# Patient Record
Sex: Male | Born: 1970 | Race: Black or African American | Hispanic: No | Marital: Single
Health system: Midwestern US, Community
[De-identification: ages and names within clinical notes are randomized; demographics above are authoritative.]

## PROBLEM LIST (undated history)

## (undated) DIAGNOSIS — R079 Chest pain, unspecified: Principal | ICD-10-CM

## (undated) DIAGNOSIS — I2510 Atherosclerotic heart disease of native coronary artery without angina pectoris: Secondary | ICD-10-CM

## (undated) DIAGNOSIS — I10 Essential (primary) hypertension: Secondary | ICD-10-CM

---

## 2011-01-22 MED ORDER — TRIMETHOPRIM-SULFAMETHOXAZOLE 160 MG-800 MG TAB
160-800 mg | ORAL_TABLET | Freq: Two times a day (BID) | ORAL | Status: AC
Start: 2011-01-22 — End: 2011-02-01

## 2011-01-22 NOTE — ED Notes (Signed)
Noticed after yesterday am mark on rt anterior forearm "bite" cont to swell and ooze white fluid. Also on rt side of head just above ear.

## 2011-01-22 NOTE — ED Notes (Signed)
I have reviewed discharge instructions with the patient.  The patient verbalized understanding.      Pt given RX for following medications at discharge   New prescriptions   Medication Sig Dispense Refill   ??? trimethoprim-sulfamethoxazole (BACTRIM DS) 160-800 mg per tablet Take 2 Tabs by mouth two (2) times a day for 10 days.  40 Tab  0   ??? lisinopril (PRINIVIL, ZESTRIL) 10 mg tablet Take  by mouth daily.         .  Pt discharged ambulatory from emergency department in no acute distress.

## 2011-01-22 NOTE — ED Provider Notes (Signed)
Patient is a 40 y.o. male presenting with Insect Bite. The history is provided by the patient.   Insect Bite  This is a new problem. The current episode started yesterday. The problem occurs daily. Pertinent negatives include no chest pain. The symptoms are aggravated by nothing. The symptoms are relieved by nothing. He has tried nothing for the symptoms.        Past Medical History   Diagnosis Date   ??? Hypertension    ??? Asthma         No past surgical history on file.      No family history on file.     History     Social History   ??? Marital Status: Single     Spouse Name: N/A     Number of Children: N/A   ??? Years of Education: N/A     Occupational History   ??? Not on file.     Social History Main Topics   ??? Smoking status: Not on file   ??? Smokeless tobacco: Not on file   ??? Alcohol Use: No   ??? Drug Use:    ??? Sexually Active:      Other Topics Concern   ??? Not on file     Social History Narrative   ??? No narrative on file                  ALLERGIES: Review of patient's allergies indicates no known allergies.      Review of Systems   Constitutional: Negative.    Cardiovascular: Negative for chest pain.   Skin: Positive for rash.   All other systems reviewed and are negative.        Filed Vitals:    01/22/11 0648   BP: 157/88   Pulse: 70   Temp: 98 ??F (36.7 ??C)   Resp: 18   Height: 6' (1.829 m)   Weight: 95.255 kg (210 lb)   SpO2: 98%            Physical Exam   Vitals reviewed.  Constitutional: He appears well-developed and well-nourished. No distress.   Musculoskeletal: Normal range of motion.   Skin: Skin is warm and dry. There is erythema (to rt fa, over lt ear.).        MDM    Procedures

## 2014-10-15 NOTE — H&P (Signed)
 [facility]    History and Physical     Assessment   Active Hospital Problems    Angioedema      HTN (hypertension)        Plan   Patient will be placed in medical unit for monitoring. Angioedema will be treated with Benadryl and Pepcid and IV Solu-Medrol.    We'll continue to monitor patient's blood pressure.    History   Christopher Cline is a 44 y.o. male who presents with   History of Present Illness  Patient states he woke this morning with tongue swelling. Had some minor difficulty swallowing but no respiratory distress. Patient can think of nothing out of the ordinary that he is done. He is ingested no new foods. He is taking no new medications. He's taken no foreign substances. Including illegal substances or over-the-counter medications. Patient states he's eaten no new foods. Patient states that he hasn't had no recent illnesses. Patient states that he does not suffer from seasonal allergies.    Patient states this morning when he woke his voice was hoarse. This has improved slightly. Patient also had some difficulty swallowing this also has improved some. The extensive patient's symptoms seem to only involve the tongue. Patient will be admitted for evaluation. Monitoring patient in the emergency room over several hours showed no significant improvement for patient's safety it was felt best to monitor him here overnight.    Past Medical History   Diagnosis Date    Hypertension      Past Surgical History   Procedure Laterality Date    Ankle fracture surgery        No Known Allergies  Prior to Admission medications    Medication Sig Start Date End Date Taking? Authorizing Provider   amLODIPine besylate (NORVASC) 10 mg tablet Take 10 mg by mouth daily.   Yes Historical Provider, MD     History     Social History    Marital Status: Single     Spouse Name: N/A    Number of Children: N/A    Years of Education: N/A     Social History Main Topics    Smoking status: Current Every  Day Smoker -- 0.50 packs/day     Types: Cigarettes    Smokeless tobacco: Not on file    Alcohol Use: Yes    Drug Use: Not on file    Sexual Activity: Not on file     Other Topics Concern    Not on file     Social History Narrative     Family History   Problem Relation Age of Onset    Hypertension Mother     Hypertension Father      Review of Systems   HENT:        Angioedema tongue       Physical Examination   Temp:  [98.1 F (36.7 C)] 98.1 F (36.7 C)  Heart Rate:  [66-84] 68  Resp:  [18-20] 20  BP: (147-154)/(84-101) 147/84 mmHg  SpO2:  [98 %-100 %] 98 %     O2 Device: None (Room air)       Physical Exam   Constitutional: He is oriented to person, place, and time. He appears well-developed and well-nourished.   HENT:   Head: Normocephalic and atraumatic.   Mouth/Throat:       Eyes: EOM are normal. Pupils are equal, round, and reactive to light.   Neck: Normal range of motion.  Cardiovascular: Normal rate, regular rhythm, normal heart sounds and intact distal pulses.    Pulmonary/Chest: Effort normal and breath sounds normal. He has no wheezes.   Abdominal: Soft. Bowel sounds are normal.   Neurological: He is alert and oriented to person, place, and time.   Psychiatric: He has a normal mood and affect. His behavior is normal.   Vitals reviewed.    Results   Labs:   No results found for this or any previous visit (from the past 24 hour(s)).  Imaging:  No results found.  ECG:  No results found.    Electronically signed:  Gladis Picker III, PA-C  10/15/2014 / 12:30 PM

## 2014-10-16 NOTE — Discharge Summary (Signed)
 [facility]  Discharge Summary    PCP: No primary care provider on file.  Discharge Details     Admit date:         10/15/2014  Discharge date:        10/16/2014   Hospital LOS:    1 days    Active Hospital Problems    Diagnosis Date Noted POA    Angioedema 10/15/2014 Yes    HTN (hypertension) 10/15/2014 Yes      Resolved Hospital Problems    Diagnosis Date Noted Date Resolved POA   No resolved problems to display.      Current Discharge Medication List      NEW medications    Details   diphenhydrAMINE (BENADRYL) 50 mg capsule Take 1 capsule (50 mg total) by mouth every 6 (six) hours.  Start date: 10/16/2014, End date: 10/26/2014      famotidine (PEPCID) 20 MG tablet Take 1 tablet (20 mg total) by mouth 2 (two) times daily.  Start date: 10/16/2014, End date: 10/16/2015      predniSONE (DELTASONE) 10 mg tablet 5 po daily for 4 days then 4 po daily for 4 days then 3 po daily for 4 days  then 2 po daily for 4 days then 1 po daily for 4 days  Start date: 10/16/2014         CHANGED medications    Details   amLODIPine besylate (NORVASC) 10 mg tablet Take 1 tablet (10 mg total) by mouth daily.  Start date: 10/16/2014, End date: 10/16/2015           Hospital Course   Physicians involved in care during this hospitalization  Attending Provider: Devere Person, MD  Attending Provider: Toribio LITTIE Croak, MD  Admitting Provider: Toribio LITTIE Croak, MD  Consulting Physician: Toribio LITTIE Croak, MD  Consulting Physician: Lamar LOISE Quan, MD    Indication for Admission: idiopathic angioedema  Hospital Course:        Mr. Mane is a 44 year old gentleman relatively healthy with only a previously diagnosed history of hypertension. He is currently out of work. He denies any recent illnesses or injuries. He presented himself to the emergency department yesterday morning with swelling of the tongue. There was no identifiable cause to this swelling. Patient was observed for several hours in the emergency department without any  significant reduction in swelling.There was no significant respiratory compromise however it was felt  Best to observe the patient overnight.    Patient was admitted to the hospital started on Benadryl IV Solu-Medrol and an H2 blocker. Patient responded well with significantly decreased swelling. Due to the swelling beneath patient's tongue ENT was asked to see patient. There was no evidence of infection. An patient's symptoms abated overnight.    On morning of discharge patient has no respiratory complaints swelling has seemed to resolve completely. And patient feels he can go home.    Patient will be discharged home and he will continue his previously prescribed antihypertensive he'll also be discharged on Pepcid oral steroids as well as an oral antihistamine.  Patient's been asked [facility]. He has no PCP. Patient has been explained the importance of follow-up for his previously diagnosed hypertension as well as reevaluation of this unexplained episode of angioedema. He states his understanding follow-up of both of these conditions.    Physical exam  Gen. No apparent distress  Head normocephalic atraumatic  Oral exam no edema of  the tongue or sublingual space submental space is nontender and without erythema or heat, voice without hoarseness  Neck supple full range of motion no lymphadenopathy or swelling  Chest rise and fall with inspiration and expiration no adventitious breath sounds  Heart regular rate and rhythm no murmurs gallops or rubs  Abdomen soft nontender nondistended normal bowel sounds no hepatosplenomegaly  Neuro no focal neurological deficits elicited on examination    Bedside Procedures     No orders found          [facility]     Activity Instructions     Activity as tolerated                     Other Instructions     Discharge instructions                   Contact information for follow-up              [facility]    [address]  [place] [address]     Phone:  [phone]                 Recommendations to physicians: Follow blood pressure    Potential for Rehab:        Good    Code Status:   Full Code      Time spent in discharge process:  less than 30 minutes       Electronically signed:  Gladis Picker III, PA-C  10/16/2014 / 9:20 AM

## 2017-12-30 ENCOUNTER — Emergency Department: Admit: 2017-12-30 | Payer: Self-pay | Primary: Student in an Organized Health Care Education/Training Program

## 2017-12-30 ENCOUNTER — Inpatient Hospital Stay
Admit: 2017-12-30 | Discharge: 2017-12-30 | Disposition: A | Discharge status: Home / Self Care | Payer: Self-pay | Attending: Emergency Medicine

## 2017-12-30 DIAGNOSIS — M25511 Pain in right shoulder: Secondary | ICD-10-CM

## 2017-12-30 MED ORDER — MELOXICAM 15 MG TAB
15 mg | ORAL_TABLET | Freq: Every day | ORAL | 0 refills | Status: AC
Start: 2017-12-30 — End: ?

## 2017-12-30 NOTE — ED Triage Notes (Signed)
Patient reports right shoulder pain starting today. 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 Denies trauma or injury

## 2017-12-30 NOTE — ED Notes (Signed)
I have reviewed discharge instructions with the patient.  The patient verbalized understanding.    Patient left ED via Discharge Method: ambulatory to Home with self.    Opportunity for questions and clarification provided.       Patient given 1 scripts.         To continue your aftercare when you leave the hospital, you may receive an automated call from our care team to check in on how you are doing.  This is a free service and part of our promise to provide the best care and service to meet your aftercare needs.??? If you have questions, or wish to unsubscribe from this service please call [phone].  Thank you for Choosing our [facility].

## 2017-12-30 NOTE — ED Provider Notes (Signed)
Patient is here with right shoulder pain that started a couple of weeks ago and got worse today.  He states he does lift heavy things at work but does not remember particular injury.  He states he may have injured it years ago, but he cannot remember what the exact injury was. He has not had any chest pain, shortness of breath, neck pain, headache, dizziness, weakness, dyspnea and exertion, fever, swelling/tingling or weakness to his arms or legs, trouble with urination or bowel movements or other new symptoms.  He was ambulatory to the room without difficulty, and well-hydrated.    The history is provided by the patient.   Shoulder Pain    The incident occurred more than 1 week ago. There was no injury mechanism. The right shoulder is affected. The pain is at a severity of 8/10. The pain is moderate. The pain has been constant since onset. The pain does not radiate. There is no history of shoulder injury. He has no other injuries. There is no history of shoulder surgery. Pertinent negatives include no numbness, no muscle weakness and no tingling.        Past Medical History:   Diagnosis Date   ??? Asthma    ??? Hypertension        No past surgical history on file.      No family history on file.    Social History     Socioeconomic History   ??? Marital status: SINGLE     Spouse name: Not on file   ??? Number of children: Not on file   ??? Years of education: Not on file   ??? Highest education level: Not on file   Occupational History   ??? Not on file   Social Needs   ??? Financial resource strain: Not on file   ??? Food insecurity:     Worry: Not on file     Inability: Not on file   ??? Transportation needs:     Medical: Not on file     Non-medical: Not on file   Tobacco Use   ??? Smoking status: Not on file   Substance and Sexual Activity   ??? Alcohol use: No   ??? Drug use: Not on file   ??? Sexual activity: Not on file   Lifestyle   ??? Physical activity:     Days per week: Not on file     Minutes per session: Not on file    ??? Stress: Not on file   Relationships   ??? Social connections:     Talks on phone: Not on file     Gets together: Not on file     Attends religious service: Not on file     Active member of club or organization: Not on file     Attends meetings of clubs or organizations: Not on file     Relationship status: Not on file   ??? Intimate partner violence:     Fear of current or ex partner: Not on file     Emotionally abused: Not on file     Physically abused: Not on file     Forced sexual activity: Not on file   Other Topics Concern   ??? Not on file   Social History Narrative   ??? Not on file         ALLERGIES: Patient has no known allergies.    Review of Systems   Constitutional: Negative.    HENT: Negative.  Eyes: Negative.    Respiratory: Negative.    Cardiovascular: Negative.    Gastrointestinal: Negative.    Genitourinary: Negative.    Musculoskeletal: Negative.         Right shoulder pain   Skin: Negative.    Neurological: Negative.  Negative for tingling and numbness.   Psychiatric/Behavioral: Negative.    All other systems reviewed and are negative.      Vitals:    12/30/17 1609   BP: 129/79   Pulse: 70   Resp: 16   Temp: 98 ??F (36.7 ??C)   SpO2: 98%   Weight: 95.3 kg (210 lb)   Height: 6' (1.829 m)            Physical Exam   Constitutional: He is oriented to person, place, and time. He appears well-developed and well-nourished.   HENT:   Head: Normocephalic and atraumatic.   Right Ear: External ear normal.   Left Ear: External ear normal.   Nose: Nose normal.   Mouth/Throat: Oropharynx is clear and moist.   Eyes: Pupils are equal, round, and reactive to light. Conjunctivae and EOM are normal.   Neck: Normal range of motion. Neck supple.   Cardiovascular: Normal rate, regular rhythm, normal heart sounds and intact distal pulses.   Pulmonary/Chest: Effort normal and breath sounds normal.   Abdominal: Soft. Bowel sounds are normal.   Musculoskeletal: Normal range of motion.        Arms:   Neurological: He is alert and oriented to person, place, and time. He has normal reflexes.   Skin: Skin is warm and dry.   Psychiatric: He has a normal mood and affect. His behavior is normal. Judgment and thought content normal.   Nursing note and vitals reviewed.       MDM  Number of Diagnoses or Management Options  Acute pain of right shoulder:      Amount and/or Complexity of Data Reviewed  Tests in the radiology section of CPT??: ordered and reviewed    Risk of Complications, Morbidity, and/or Mortality  Presenting problems: moderate  Diagnostic procedures: moderate  Management options: moderate    Patient Progress  Patient progress: stable         Procedures    The patient was observed in the ED.    Results Reviewed:  XR SHOULDER RT AP/LAT MIN 2 V   Final Result   Impression:      No acute osseous or joint abnormalities.                    Patient was referred to orthopedics today for further evaluation.  He may need an MRI for definitive care.  We have placed him in a sling for comfort.  I have written some meloxicam at home daily to see if that would help with his pain.  He was shown stretching exercises at home to do daily. Rest, ice, elevate, avoid painful activities. ED if worse. Follow up with Ortho for recheck. Patient is stable for discharge and ambulatory out of the ER without difficulty.    I discussed the results of all labs, procedures, radiographs, and treatments with the patient and available family.  Treatment plan is agreed upon and the patient is ready for discharge.  All voiced understanding of the discharge plan and medication instructions or changes as appropriate.  Questions about treatment in the ED were answered.  All were encouraged to return should symptoms worsen or new problems develop.

## 2017-12-30 NOTE — ED Notes (Signed)
 I have reviewed discharge instructions with the patient.  The patient verbalized understanding.    Patient left ED via Discharge Method: ambulatory to Home with self.    Opportunity for questions and clarification provided.       Patient given 1 scripts.         To continue your aftercare when you leave the hospital, you may receive an automated call from our care team to check in on how you are doing.  This is a free service and part of our promise to provide the best care and service to meet your aftercare needs." If you have questions, or wish to unsubscribe from this service please call [phone].  Thank you for Choosing our [facility].

## 2017-12-30 NOTE — ED Provider Notes (Signed)
ED Provider Notes by Joanie Coddington, PA at 12/30/17 1654                Author: Joanie Coddington, PA  Service: Emergency Medicine  Author Type: Physician Assistant       Filed: 12/30/17 1730  Date of Service: 12/30/17 1654  Status: Attested           Editor: Morehouse-Moore, Charm Barges, PA (Physician Assistant)  Cosigner: Flowers, Damon Ruffin Pyo., MD at 12/30/17 1824          Attestation signed by Collier Salina, Bonner Puna., MD at 12/30/17 1824          I was personally available for consultation in the emergency department.  I have reviewed the chart and agree with the documentation recorded by the [facility], including  the assessment, treatment plan, and disposition.   Damon Steger Sheral Flow., MD                                    Patient is here with right shoulder pain that started a couple of weeks ago and got worse today.  He states he  does lift heavy things at work but does not remember particular injury.  He states he may have injured it years ago, but he cannot remember what the exact injury was. He has not had any chest pain, shortness of breath, neck pain, headache, dizziness,  weakness, dyspnea and exertion, fever, swelling/tingling or weakness to his arms or legs, trouble with urination or bowel movements or other new symptoms.  He was ambulatory to the room without difficulty, and well-hydrated.      The history is provided by the patient.    Shoulder Pain     The  incident occurred more than 1 week ago. There was no injury mechanism. The right shoulder is affected. The pain is at a severity of 8/10.  The pain is moderate. The pain has been constant since onset. The pain does not radiate. There is no history of shoulder injury. He  has no other injuries. There is no history of shoulder surgery. Pertinent negatives include no numbness, no muscle weakness and no tingling.             Past Medical History:        Diagnosis  Date         ?  Asthma           ?  Hypertension             No  past surgical history on file.        No family history on file.        Social History          Socioeconomic History         ?  Marital status:  SINGLE              Spouse name:  Not on file         ?  Number of children:  Not on file     ?  Years of education:  Not on file     ?  Highest education level:  Not on file       Occupational History        ?  Not on file       Social Needs         ?  Financial resource strain:  Not on file        ?  Food insecurity:              Worry:  Not on file         Inability:  Not on file        ?  Transportation needs:              Medical:  Not on file              Non-medical:  Not on file       Tobacco Use         ?  Smoking status:  Not on file       Substance and Sexual Activity         ?  Alcohol use:  No     ?  Drug use:  Not on file     ?  Sexual activity:  Not on file       Lifestyle        ?  Physical activity:              Days per week:  Not on file         Minutes per session:  Not on file         ?  Stress:  Not on file       Relationships        ?  Social connections:              Talks on phone:  Not on file         Gets together:  Not on file         Attends religious service:  Not on file         Active member of club or organization:  Not on file         Attends meetings of clubs or organizations:  Not on file         Relationship status:  Not on file        ?  Intimate partner violence:              Fear of current or ex partner:  Not on file         Emotionally abused:  Not on file         Physically abused:  Not on file         Forced sexual activity:  Not on file        Other Topics  Concern        ?  Not on file       Social History Narrative        ?  Not on file              ALLERGIES: Patient has no known allergies.      Review of Systems    Constitutional: Negative.     HENT: Negative.     Eyes: Negative.     Respiratory: Negative.     Cardiovascular: Negative.     Gastrointestinal: Negative.     Genitourinary: Negative.     Musculoskeletal:  Negative.          Right shoulder pain    Skin: Negative.     Neurological: Negative.  Negative for tingling and numbness.    Psychiatric/Behavioral: Negative.     All other systems reviewed and are negative.  Vitals:          12/30/17 1609        BP:  129/79     Pulse:  70     Resp:  16     Temp:  98 ??F (36.7 ??C)     SpO2:  98%     Weight:  95.3 kg (210 lb)        Height:  6' (1.829 m)                Physical Exam    Constitutional: He is oriented to person, place, and time. He appears well-developed and well-nourished.    HENT:    Head: Normocephalic and atraumatic.   Right Ear: External ear normal.   Left Ear: External ear normal.    Nose: Nose normal.    Mouth/Throat: Oropharynx is clear and moist.    Eyes: Pupils are equal, round, and reactive to light. Conjunctivae and EOM are normal.    Neck: Normal range of motion. Neck supple.    Cardiovascular: Normal rate, regular rhythm, normal heart sounds and intact distal pulses.    Pulmonary/Chest: Effort normal and breath sounds normal.    Abdominal: Soft. Bowel sounds are normal.   Musculoskeletal: Normal range of motion.        Arms:    Neurological: He is alert and oriented to person, place, and time. He has  normal reflexes.    Skin: Skin is warm and dry.   Psychiatric: He has a normal mood and affect. His behavior is normal. Judgment and thought content  normal.    Nursing note and vitals reviewed.          MDM   Number of Diagnoses or Management Options   Acute pain of right shoulder:        Amount and/or Complexity of Data Reviewed   Tests in the radiology section of CPT??: ordered and reviewed      Risk of Complications, Morbidity, and/or Mortality   Presenting problems: moderate  Diagnostic procedures: moderate  Management options: moderate     Patient Progress   Patient progress: stable             Procedures      The patient was observed in the ED.      Results Reviewed:     XR SHOULDER RT AP/LAT MIN 2 V       Final Result     Impression:           No acute osseous or joint abnormalities.                                   Patient was referred to orthopedics today for further evaluation.  He may need an MRI for definitive care.  We have placed him in a sling for comfort.  I have written some meloxicam at home daily to see if that would help with his pain.  He was shown  stretching exercises at home to do daily. Rest, ice, elevate, avoid painful activities. ED if worse. Follow up with Ortho for recheck. Patient is stable for discharge and ambulatory out of the ER without difficulty.      I discussed the results of all labs, procedures, radiographs, and treatments with the patient and available family.  Treatment plan is agreed upon and the patient is ready for discharge.   All voiced understanding of the  discharge plan and medication instructions or changes as appropriate.  Questions about treatment in the ED were answered.  All were encouraged to return should symptoms worsen or new problems develop.

## 2017-12-30 NOTE — ED Notes (Signed)
Patient reports right shoulder pain starting today. 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 Denies trauma or injury

## 2018-01-03 ENCOUNTER — Inpatient Hospital Stay
Admit: 2018-01-03 | Discharge: 2018-01-03 | Disposition: A | Discharge status: Home / Self Care | Payer: Self-pay | Attending: Emergency Medicine

## 2018-01-03 DIAGNOSIS — K5289 Other specified noninfective gastroenteritis and colitis: Secondary | ICD-10-CM

## 2018-01-03 LAB — CBC WITH AUTOMATED DIFF
ABS. BASOPHILS: 0 10*3/uL (ref 0.0–0.2)
ABS. EOSINOPHILS: 0.1 10*3/uL (ref 0.0–0.8)
ABS. IMM. GRANS.: 0 10*3/uL (ref 0.0–0.5)
ABS. LYMPHOCYTES: 1.6 10*3/uL (ref 0.5–4.6)
ABS. MONOCYTES: 0.4 10*3/uL (ref 0.1–1.3)
ABS. NEUTROPHILS: 3.4 10*3/uL (ref 1.7–8.2)
ABSOLUTE NRBC: 0 10*3/uL (ref 0.0–0.2)
BASOPHILS: 0 % (ref 0.0–2.0)
EOSINOPHILS: 2 % (ref 0.5–7.8)
HCT: 42.3 % (ref 41.1–50.3)
HGB: 13.6 g/dL (ref 13.6–17.2)
IMMATURE GRANULOCYTES: 1 % (ref 0.0–5.0)
LYMPHOCYTES: 28 % (ref 13–44)
MCH: 29 PG (ref 26.1–32.9)
MCHC: 32.2 g/dL (ref 31.4–35.0)
MCV: 90.2 FL (ref 79.6–97.8)
MONOCYTES: 7 % (ref 4.0–12.0)
MPV: 10 FL (ref 9.4–12.3)
NEUTROPHILS: 62 % (ref 43–78)
PLATELET: 195 10*3/uL (ref 150–450)
RBC: 4.69 M/uL (ref 4.23–5.6)
RDW: 14 % (ref 11.9–14.6)
WBC: 5.6 10*3/uL (ref 4.3–11.1)

## 2018-01-03 LAB — C REACTIVE PROTEIN, QT: C-Reactive protein: 0.4 mg/dL (ref 0.0–0.9)

## 2018-01-03 LAB — METABOLIC PANEL, COMPREHENSIVE
A-G Ratio: 1.2 (ref 1.2–3.5)
ALT (SGPT): 25 U/L (ref 12–65)
AST (SGOT): 43 U/L — ABNORMAL HIGH (ref 15–37)
Albumin: 4.1 g/dL (ref 3.5–5.0)
Alk. phosphatase: 91 U/L (ref 50–136)
Anion gap: 7 mmol/L (ref 7–16)
BUN: 15 MG/DL (ref 6–23)
Bilirubin, total: 0.4 MG/DL (ref 0.2–1.1)
CO2: 26 mmol/L (ref 21–32)
Calcium: 9.3 MG/DL (ref 8.3–10.4)
Chloride: 106 mmol/L (ref 98–107)
Creatinine: 1.32 MG/DL (ref 0.8–1.5)
GFR est AA: >60 mL/min/{1.73_m2} (ref >60)
GFR est non-AA: >60 mL/min/{1.73_m2} (ref >60)
Globulin: 3.4 g/dL (ref 2.3–3.5)
Glucose: 83 mg/dL (ref 65–100)
Potassium: 5 mmol/L (ref 3.5–5.1)
Protein, total: 7.5 g/dL (ref 6.3–8.2)
Sodium: 139 mmol/L (ref 136–145)

## 2018-01-03 LAB — LIPASE
Lipase: 99 U/L (ref 73–393)
Lipase: 99 U/L (ref 73–393)

## 2018-01-03 LAB — CBC WITH AUTO DIFFERENTIAL
Basophils %: 0 % (ref 0.0–2.0)
Basophils Absolute: 0 10*3/uL (ref 0.0–0.2)
Eosinophils %: 2 % (ref 0.5–7.8)
Eosinophils Absolute: 0.1 10*3/uL (ref 0.0–0.8)
Granulocyte Absolute Count: 0 10*3/uL (ref 0.0–0.5)
Hematocrit: 42.3 % (ref 41.1–50.3)
Hemoglobin: 13.6 g/dL (ref 13.6–17.2)
Immature Granulocytes: 1 % (ref 0.0–5.0)
Lymphocytes %: 28 % (ref 13–44)
Lymphocytes Absolute: 1.6 10*3/uL (ref 0.5–4.6)
MCH: 29 PG (ref 26.1–32.9)
MCHC: 32.2 g/dL (ref 31.4–35.0)
MCV: 90.2 FL (ref 79.6–97.8)
MPV: 10 FL (ref 9.4–12.3)
Monocytes %: 7 % (ref 4.0–12.0)
Monocytes Absolute: 0.4 10*3/uL (ref 0.1–1.3)
NRBC Absolute: 0 10*3/uL (ref 0.0–0.2)
Neutrophils %: 62 % (ref 43–78)
Neutrophils Absolute: 3.4 10*3/uL (ref 1.7–8.2)
Platelets: 195 10*3/uL (ref 150–450)
RBC: 4.69 M/uL (ref 4.23–5.6)
RDW: 14 % (ref 11.9–14.6)
WBC: 5.6 10*3/uL (ref 4.3–11.1)

## 2018-01-03 LAB — COMPREHENSIVE METABOLIC PANEL
ALT: 25 U/L (ref 12–65)
AST: 43 U/L — ABNORMAL HIGH (ref 15–37)
Albumin/Globulin Ratio: 1.2 (ref 1.2–3.5)
Albumin: 4.1 g/dL (ref 3.5–5.0)
Alkaline Phosphatase: 91 U/L (ref 50–136)
Anion Gap: 7 mmol/L (ref 7–16)
BUN: 15 MG/DL (ref 6–23)
CO2: 26 mmol/L (ref 21–32)
Calcium: 9.3 MG/DL (ref 8.3–10.4)
Chloride: 106 mmol/L (ref 98–107)
Creatinine: 1.32 MG/DL (ref 0.8–1.5)
EGFR IF NonAfrican American: >60 mL/min/{1.73_m2} (ref >60)
GFR African American: >60 mL/min/{1.73_m2} (ref >60)
Globulin: 3.4 g/dL (ref 2.3–3.5)
Glucose: 83 mg/dL (ref 65–100)
Potassium: 5 mmol/L (ref 3.5–5.1)
Sodium: 139 mmol/L (ref 136–145)
Total Bilirubin: 0.4 MG/DL (ref 0.2–1.1)
Total Protein: 7.5 g/dL (ref 6.3–8.2)

## 2018-01-03 LAB — C-REACTIVE PROTEIN: CRP: 0.4 mg/dL (ref 0.0–0.9)

## 2018-01-03 MED ORDER — HYOSCYAMINE SULFATE 0.125 MG TAB
0.125 mg | ORAL_TABLET | ORAL | 0 refills | Status: AC | PRN
Start: 2018-01-03 — End: ?

## 2018-01-03 MED ORDER — ONDANSETRON HCL 4 MG TAB
4 mg | ORAL_TABLET | Freq: Three times a day (TID) | ORAL | 0 refills | Status: AC | PRN
Start: 2018-01-03 — End: ?

## 2018-01-03 MED ORDER — HYOSCYAMINE 0.125 MG SUBLINGUAL TAB
0.125 mg | SUBLINGUAL | Status: AC
Start: 2018-01-03 — End: 2018-01-03
  Administered 2018-01-03: 15:00:00 via SUBLINGUAL

## 2018-01-03 MED ORDER — KETOROLAC TROMETHAMINE 30 MG/ML INJECTION
30 mg/mL (1 mL) | INTRAMUSCULAR | Status: AC
Start: 2018-01-03 — End: 2018-01-03
  Administered 2018-01-03: 15:00:00 via INTRAVENOUS

## 2018-01-03 MED ORDER — ONDANSETRON (PF) 4 MG/2 ML INJECTION
4 mg/2 mL | INTRAMUSCULAR | Status: AC
Start: 2018-01-03 — End: 2018-01-03
  Administered 2018-01-03: 15:00:00 via INTRAVENOUS

## 2018-01-03 MED FILL — KETOROLAC TROMETHAMINE 30 MG/ML INJECTION: 30 mg/mL (1 mL) | INTRAMUSCULAR | Qty: 1

## 2018-01-03 MED FILL — ONDANSETRON (PF) 4 MG/2 ML INJECTION: 4 mg/2 mL | INTRAMUSCULAR | Qty: 2

## 2018-01-03 MED FILL — HYOSCYAMINE 0.125 MG SUBLINGUAL TAB: 0.125 mg | SUBLINGUAL | Qty: 1

## 2018-01-03 NOTE — ED Provider Notes (Signed)
Pt reports right sided abd pain that started today. States diarrhea for 2 days. Pt was recently diagnosed with colitis. Arrives via EMS. Denies vomiting, reports nausea. Denies blood in stool. Hx of HTN.  Patient states she was diagnosed with ulcerative colitis while living in [place].  Those records are unavailable for review.  He also states he has been on antibiotics within the last month.    The history is provided by the patient.   Abdominal Pain    This is a new problem. The current episode started 12 to 24 hours ago. The problem occurs constantly. The problem has not changed since onset.Associated with: diarrhea and a history of colitis. The pain is located in the generalized abdominal region. The quality of the pain is cramping. The pain is at a severity of 9/10. The pain is severe. Associated symptoms include diarrhea and dysuria. Pertinent negatives include no anorexia, no fever, no belching, no flatus, no hematochezia, no melena, no nausea, no vomiting, no constipation, no frequency, no hematuria, no headaches, no arthralgias, no myalgias, no trauma, no chest pain and no back pain. Nothing worsens the pain. The pain is relieved by nothing. His past medical history is significant for ulcerative colitis. The patient's surgical history non-contributory.       Past Medical History:   Diagnosis Date   ??? Asthma    ??? Hypertension        History reviewed. No pertinent surgical history.      History reviewed. No pertinent family history.    Social History     Socioeconomic History   ??? Marital status: SINGLE     Spouse name: Not on file   ??? Number of children: Not on file   ??? Years of education: Not on file   ??? Highest education level: Not on file   Occupational History   ??? Not on file   Social Needs   ??? Financial resource strain: Not on file   ??? Food insecurity:     Worry: Not on file     Inability: Not on file   ??? Transportation needs:     Medical: Not on file     Non-medical: Not on file   Tobacco Use    ??? Smoking status: Not on file   Substance and Sexual Activity   ??? Alcohol use: No   ??? Drug use: Not on file   ??? Sexual activity: Not on file   Lifestyle   ??? Physical activity:     Days per week: Not on file     Minutes per session: Not on file   ??? Stress: Not on file   Relationships   ??? Social connections:     Talks on phone: Not on file     Gets together: Not on file     Attends religious service: Not on file     Active member of club or organization: Not on file     Attends meetings of clubs or organizations: Not on file     Relationship status: Not on file   ??? Intimate partner violence:     Fear of current or ex partner: Not on file     Emotionally abused: Not on file     Physically abused: Not on file     Forced sexual activity: Not on file   Other Topics Concern   ??? Not on file   Social History Narrative   ??? Not on file  ALLERGIES: Lisinopril and Lisinopril    Review of Systems   Constitutional: Negative for fever.   Cardiovascular: Negative for chest pain.   Gastrointestinal: Positive for abdominal pain and diarrhea. Negative for anorexia, constipation, flatus, hematochezia, melena, nausea and vomiting.   Genitourinary: Positive for dysuria. Negative for frequency and hematuria.   Musculoskeletal: Negative for arthralgias, back pain and myalgias.   Neurological: Negative for headaches.   All other systems reviewed and are negative.      Vitals:    01/03/18 1004 01/03/18 1112   BP: 123/83 131/79   Pulse: 68 60   Resp: 16    Temp: 97.5 ??F (36.4 ??C)    SpO2: 96% 99%   Weight: 89.8 kg (198 lb)    Height: 6' (1.829 m)             Physical Exam   Constitutional: He is oriented to person, place, and time. He appears well-developed and well-nourished.  Non-toxic appearance. He does not appear ill. No distress.   HENT:   Head: Normocephalic and atraumatic.   Eyes: Pupils are equal, round, and reactive to light. Conjunctivae and EOM are normal.   Neck: Normal range of motion. Neck supple.    Cardiovascular: Normal rate and regular rhythm.   Pulmonary/Chest: Effort normal and breath sounds normal.   Abdominal: Soft. Bowel sounds are normal. He exhibits no distension and no mass. There is tenderness. There is no rebound and no guarding. No hernia.   Bowel sounds are present, abdomen is minimally and diffusely tender.  No guarding or rigidity   Musculoskeletal: Normal range of motion.   Neurological: He is alert and oriented to person, place, and time.   Skin: Skin is warm and dry. Capillary refill takes less than 2 seconds. He is not diaphoretic.   Psychiatric: He has a normal mood and affect. His behavior is normal.   Nursing note and vitals reviewed.       MDM  Number of Diagnoses or Management Options     Amount and/or Complexity of Data Reviewed  Clinical lab tests: ordered and reviewed  Review and summarize past medical records: yes  Independent visualization of images, tracings, or specimens: yes    Risk of Complications, Morbidity, and/or Mortality  Presenting problems: moderate  Diagnostic procedures: moderate  Management options: moderate    Patient Progress  Patient progress: stable         Procedures

## 2018-01-03 NOTE — ED Notes (Signed)
I have reviewed discharge instructions with the patient.  The patient verbalized understanding.    Patient left ED via Discharge Method: ambulatory to Home with self  Opportunity for questions and clarification provided.       Patient given 2 scripts.         To continue your aftercare when you leave the hospital, you may receive an automated call from our care team to check in on how you are doing.  This is a free service and part of our promise to provide the best care and service to meet your aftercare needs.??? If you have questions, or wish to unsubscribe from this service please call [phone].  Thank you for Choosing our [facility].

## 2018-01-03 NOTE — ED Notes (Signed)
Unable to obtain blood in triage

## 2018-01-03 NOTE — ED Notes (Signed)
bloodwork sent to lab

## 2018-01-03 NOTE — ED Notes (Signed)
Blood recollect sent down to lab

## 2018-01-03 NOTE — ED Triage Notes (Signed)
Pt reports right sided abd pain that started today. States diarrhea for 2 days. Pt was recently diagnosed with colitis. Arrives via EMS. Denies vomiting, reports nausea. Denies blood in stool. Hx of HTN

## 2018-01-03 NOTE — ED Notes (Signed)
Pt asked for a urine sample. States "I will try."

## 2018-01-03 NOTE — ED Notes (Signed)
Pt reports right sided abd pain that started today. States diarrhea for 2 days. Pt was recently diagnosed with colitis. Arrives via EMS. Denies vomiting, reports nausea. Denies blood in stool. Hx of HTN

## 2018-01-03 NOTE — ED Notes (Signed)
Blood recollect sent down to lab

## 2018-01-03 NOTE — ED Provider Notes (Signed)
Pt reports right sided abd pain that started today. States diarrhea for 2 days. Pt was recently diagnosed with colitis. Arrives via EMS. Denies vomiting, reports nausea. Denies blood in stool. Hx of HTN.  Patient states she was diagnosed with ulcerative colitis while living in [place].  Those records are unavailable for review.  He also states he has been on antibiotics within the last month.    The history is provided by the patient.   Abdominal Pain    This is a new problem. The current episode started 12 to 24 hours ago. The problem occurs constantly. The problem has not changed since onset.Associated with: diarrhea and a history of colitis. The pain is located in the generalized abdominal region. The quality of the pain is cramping. The pain is at a severity of 9/10. The pain is severe. Associated symptoms include diarrhea and dysuria. Pertinent negatives include no anorexia, no fever, no belching, no flatus, no hematochezia, no melena, no nausea, no vomiting, no constipation, no frequency, no hematuria, no headaches, no arthralgias, no myalgias, no trauma, no chest pain and no back pain. Nothing worsens the pain. The pain is relieved by nothing. His past medical history is significant for ulcerative colitis. The patient's surgical history non-contributory.       Past Medical History:   Diagnosis Date   ??? Asthma    ??? Hypertension        History reviewed. No pertinent surgical history.      History reviewed. No pertinent family history.    Social History     Socioeconomic History   ??? Marital status: SINGLE     Spouse name: Not on file   ??? Number of children: Not on file   ??? Years of education: Not on file   ??? Highest education level: Not on file   Occupational History   ??? Not on file   Social Needs   ??? Financial resource strain: Not on file   ??? Food insecurity:     Worry: Not on file     Inability: Not on file   ??? Transportation needs:     Medical: Not on file     Non-medical: Not on file   Tobacco Use   ???  Smoking status: Not on file   Substance and Sexual Activity   ??? Alcohol use: No   ??? Drug use: Not on file   ??? Sexual activity: Not on file   Lifestyle   ??? Physical activity:     Days per week: Not on file     Minutes per session: Not on file   ??? Stress: Not on file   Relationships   ??? Social connections:     Talks on phone: Not on file     Gets together: Not on file     Attends religious service: Not on file     Active member of club or organization: Not on file     Attends meetings of clubs or organizations: Not on file     Relationship status: Not on file   ??? Intimate partner violence:     Fear of current or ex partner: Not on file     Emotionally abused: Not on file     Physically abused: Not on file     Forced sexual activity: Not on file   Other Topics Concern   ??? Not on file   Social History Narrative   ??? Not on file  ALLERGIES: Lisinopril and Lisinopril    Review of Systems   Constitutional: Negative for fever.   Cardiovascular: Negative for chest pain.   Gastrointestinal: Positive for abdominal pain and diarrhea. Negative for anorexia, constipation, flatus, hematochezia, melena, nausea and vomiting.   Genitourinary: Positive for dysuria. Negative for frequency and hematuria.   Musculoskeletal: Negative for arthralgias, back pain and myalgias.   Neurological: Negative for headaches.   All other systems reviewed and are negative.      Vitals:    01/03/18 1004 01/03/18 1112   BP: 123/83 131/79   Pulse: 68 60   Resp: 16    Temp: 97.5 ??F (36.4 ??C)    SpO2: 96% 99%   Weight: 89.8 kg (198 lb)    Height: 6' (1.829 m)             Physical Exam   Constitutional: He is oriented to person, place, and time. He appears well-developed and well-nourished.  Non-toxic appearance. He does not appear ill. No distress.   HENT:   Head: Normocephalic and atraumatic.   Eyes: Pupils are equal, round, and reactive to light. Conjunctivae and EOM are normal.   Neck: Normal range of motion. Neck supple.   Cardiovascular: Normal  rate and regular rhythm.   Pulmonary/Chest: Effort normal and breath sounds normal.   Abdominal: Soft. Bowel sounds are normal. He exhibits no distension and no mass. There is tenderness. There is no rebound and no guarding. No hernia.   Bowel sounds are present, abdomen is minimally and diffusely tender.  No guarding or rigidity   Musculoskeletal: Normal range of motion.   Neurological: He is alert and oriented to person, place, and time.   Skin: Skin is warm and dry. Capillary refill takes less than 2 seconds. He is not diaphoretic.   Psychiatric: He has a normal mood and affect. His behavior is normal.   Nursing note and vitals reviewed.       MDM  Number of Diagnoses or Management Options     Amount and/or Complexity of Data Reviewed  Clinical lab tests: ordered and reviewed  Review and summarize past medical records: yes  Independent visualization of images, tracings, or specimens: yes    Risk of Complications, Morbidity, and/or Mortality  Presenting problems: moderate  Diagnostic procedures: moderate  Management options: moderate    Patient Progress  Patient progress: stable         Procedures

## 2018-01-03 NOTE — ED Notes (Signed)

## 2018-01-03 NOTE — ED Notes (Signed)
Pt asked for a urine sample. States "I will try."

## 2018-10-08 ENCOUNTER — Encounter (HOSPITAL_COMMUNITY): Payer: Self-pay

## 2018-10-08 ENCOUNTER — Other Ambulatory Visit: Payer: Self-pay

## 2018-10-08 ENCOUNTER — Emergency Department (HOSPITAL_COMMUNITY)
Admission: EM | Admit: 2018-10-08 | Discharge: 2018-10-08 | Disposition: A | Discharge status: Home / Self Care | Payer: Self-pay | Attending: Emergency Medicine | Admitting: Emergency Medicine

## 2018-10-08 DIAGNOSIS — G8929 Other chronic pain: Secondary | ICD-10-CM

## 2018-10-08 DIAGNOSIS — M545 Low back pain: Secondary | ICD-10-CM | POA: Insufficient documentation

## 2018-10-08 LAB — POCT I-STAT EG7
Acid-base deficit: 1 mmol/L (ref 0.0–2.0)
Bicarbonate: 23 mmol/L (ref 20.0–28.0)
Calcium, Ion: 1.09 mmol/L — ABNORMAL LOW (ref 1.15–1.40)
HCT: 40 % (ref 39.0–52.0)
Hemoglobin: 13.6 g/dL (ref 13.0–17.0)
O2 SAT: 97 %
PO2 VEN: 92 mmHg — AB (ref 32.0–45.0)
Potassium: 3.8 mmol/L (ref 3.5–5.1)
Sodium: 142 mmol/L (ref 135–145)
TCO2: 24 mmol/L (ref 22–32)
pCO2, Ven: 36.1 mmHg — ABNORMAL LOW (ref 44.0–60.0)
pH, Ven: 7.412 (ref 7.250–7.430)

## 2018-10-08 LAB — URINALYSIS, ROUTINE W REFLEX MICROSCOPIC
Bilirubin Urine: NEGATIVE
Glucose, UA: NEGATIVE mg/dL
Hgb urine dipstick: NEGATIVE
Ketones, ur: NEGATIVE mg/dL
Leukocytes,Ua: NEGATIVE
NITRITE: NEGATIVE
Protein, ur: NEGATIVE mg/dL
Specific Gravity, Urine: 1.014 (ref 1.005–1.030)
pH: 5 (ref 5.0–8.0)

## 2018-10-08 LAB — I-STAT CREATININE, ED: CREATININE: 1.2 mg/dL (ref 0.61–1.24)

## 2018-10-08 LAB — CBG MONITORING, ED: GLUCOSE-CAPILLARY: 77 mg/dL (ref 70–99)

## 2018-10-08 MED ORDER — LIDOCAINE 5 % EX PTCH: 1.0000 | MEDICATED_PATCH | CUTANEOUS | 0 refills | Status: DC

## 2018-10-08 MED ORDER — LIDOCAINE 5 % EX PTCH
1.0000 | MEDICATED_PATCH | CUTANEOUS | Status: DC
  Administered 2018-10-08: 1 via TRANSDERMAL
  Filled 2018-10-08: qty 1

## 2018-10-08 MED ORDER — ACETAMINOPHEN 500 MG PO TABS
1000.0000 mg | ORAL_TABLET | Freq: Once | ORAL | Status: AC
  Administered 2018-10-08: 1000 mg via ORAL
  Filled 2018-10-08: qty 2

## 2018-10-08 MED ORDER — IBUPROFEN 800 MG PO TABS
800.0000 mg | ORAL_TABLET | Freq: Once | ORAL | Status: AC
  Administered 2018-10-08: 800 mg via ORAL
  Filled 2018-10-08: qty 1

## 2018-10-08 NOTE — ED Notes (Signed)
Patient verbalizes understanding of discharge instructions. Opportunity for questioning and answers were provided. Armband removed by staff, pt discharged from ED. Ambulated out to lobby  

## 2018-10-08 NOTE — ED Provider Notes (Signed)
[facility] EMERGENCY DEPARTMENT Provider Note   CSN: [id number] Arrival date & time: 10/08/18  0256    History   Chief Complaint Chief Complaint  Patient presents with  . Back Pain    lower bk; with increased urination    HPI Oscar Daniels is a 48 y.o. male.     The history is provided by the patient.  Back Pain  Location:  Gluteal region Quality:  Aching and stabbing Radiates to:  Does not radiate Pain severity:  Severe Pain is:  Same all the time Onset quality:  Unable to specify Duration:  36 months Timing:  Constant Progression:  Worsening Chronicity:  Chronic Context: not emotional stress, not falling, not jumping from heights, not lifting heavy objects, not MCA, not MVA, not occupational injury, not pedestrian accident, not physical stress, not recent illness, not recent injury and not twisting   Relieved by:  Nothing Worsened by:  Nothing Ineffective treatments:  None tried Associated symptoms: no abdominal pain, no abdominal swelling, no bladder incontinence, no bowel incontinence, no chest pain, no dysuria, no fever, no leg pain, no numbness, no paresthesias, no pelvic pain, no perianal numbness, no tingling, no weakness and no weight loss   Risk factors: no hx of cancer and no recent surgery   No numbness no difficulty urinating or defecating.  No change in gait.  No f/c/r.  No cough.  Urine is frequent and smells "stale".    History reviewed. No pertinent past medical history.  There are no active problems to display for this patient.      Home Medications    Prior to Admission medications   Not on File    Family History History reviewed. No pertinent family history.  Social History Social History   Tobacco Use  . Smoking status: Not on file  Substance Use Topics  . Alcohol use: Not on file  . Drug use: Not on file     Allergies   Patient has no known allergies.   Review of Systems Review of Systems   Constitutional: Negative for fever and weight loss.  HENT: Negative for sore throat.   Eyes: Negative for photophobia.  Respiratory: Negative for cough and shortness of breath.   Cardiovascular: Negative for chest pain.  Gastrointestinal: Negative for abdominal pain and bowel incontinence.  Genitourinary: Positive for frequency. Negative for bladder incontinence, difficulty urinating, discharge, dysuria, hematuria, pelvic pain, penile pain and testicular pain.  Musculoskeletal: Positive for back pain. Negative for gait problem.  Neurological: Negative for tingling, seizures, speech difficulty, weakness, numbness and paresthesias.  All other systems reviewed and are negative.    Physical Exam Updated Vital Signs Temp 97.9 F (36.6 C) (Oral)   Physical Exam Vitals signs and nursing note reviewed.  Constitutional:      Appearance: Oscar Daniels is normal weight.  HENT:     Head: Normocephalic and atraumatic.     Nose: Nose normal.  Eyes:     Conjunctiva/sclera: Conjunctivae normal.     Pupils: Pupils are equal, round, and reactive to light.  Neck:     Musculoskeletal: Normal range of motion and neck supple.  Cardiovascular:     Rate and Rhythm: Normal rate and regular rhythm.     Pulses: Normal pulses.     Heart sounds: Normal heart sounds.  Pulmonary:     Effort: Pulmonary effort is normal.     Breath sounds: Normal breath sounds.  Abdominal:     General: Abdomen is flat. Bowel  sounds are normal.     Tenderness: There is no abdominal tenderness. There is no guarding.     Hernia: No hernia is present.  Musculoskeletal: Normal range of motion.  Skin:    General: Skin is warm and dry.     Capillary Refill: Capillary refill takes less than 2 seconds.  Neurological:     General: No focal deficit present.     Mental Status: Oscar Daniels is alert and oriented to person, place, and time.     Sensory: No sensory deficit.     Motor: No weakness.     Deep Tendon Reflexes: Reflexes normal.   Psychiatric:        Mood and Affect: Mood normal.        Behavior: Behavior normal.      ED Treatments / Results  Labs (all labs ordered are listed, but only abnormal results are displayed) Results for orders placed or performed during the hospital encounter of 10/08/18  I-stat Creatinine, ED  Result Value Ref Range   Creatinine, Ser 1.20 0.61 - 1.24 mg/dL  POCT I-Stat EG7  Result Value Ref Range   pH, Ven 7.412 7.250 - 7.430   pCO2, Ven 36.1 (L) 44.0 - 60.0 mmHg   pO2, Ven 92.0 (H) 32.0 - 45.0 mmHg   Bicarbonate 23.0 20.0 - 28.0 mmol/L   TCO2 24 22 - 32 mmol/L   O2 Saturation 97.0 %   Acid-base deficit 1.0 0.0 - 2.0 mmol/L   Sodium 142 135 - 145 mmol/L   Potassium 3.8 3.5 - 5.1 mmol/L   Calcium, Ion 1.09 (L) 1.15 - 1.40 mmol/L   HCT 40.0 39.0 - 52.0 %   Hemoglobin 13.6 13.0 - 17.0 g/dL   Patient temperature HIDE    Sample type VENOUS    No results found.  Radiology No results found.  Procedures Procedures (including critical care time)  Medications Ordered in ED Medications  acetaminophen (TYLENOL) tablet 1,000 mg (has no administration in time range)  ibuprofen (ADVIL,MOTRIN) tablet 800 mg (has no administration in time range)  lidocaine (LIDODERM) 5 % 1 patch (has no administration in time range)       Final Clinical Impressions(s) / ED Diagnoses   Return for intractable cough, coughing up blood,fevers >100.4 unrelieved by medication, shortness of breath, intractable vomiting, chest pain, shortness of breath, weakness,numbness, changes in speech, facial asymmetry,abdominal pain, passing out,Inability to tolerate liquids or food, cough, altered mental status or any concerns. No signs of systemic illness or infection. The patient is nontoxic-appearing on exam and vital signs are within normal limits.   I have reviewed the triage vital signs and the nursing notes. Pertinent labs &imaging results that were available during my care of the patient were  reviewed by me and considered in my medical decision making (see chart for details).  After history, exam, and medical workup I feel the patient has been appropriately medically screened and is safe for discharge home. Pertinent diagnoses were discussed with the patient. Patient was given return precautions.   Jmari Pelc, MD 10/08/18 [phone]

## 2018-10-08 NOTE — ED Triage Notes (Signed)
Pt arrived via [facility]; [facility] from [facility] with c/o lower bk pain and increased urination; 160/82; 80; 100% on RA; 98.1; no CBG; pt c/o 10/10

## 2018-10-28 ENCOUNTER — Encounter: Payer: Self-pay | Admitting: *Deleted

## 2018-10-28 NOTE — Congregational Nurse Program (Signed)
COVID 19 Hotel Screening: Patient reports kidney stones with a new diagnosis 2 weeks ago. Patient stated he moved from [place] to [facility] in March 2020.

## 2018-10-31 ENCOUNTER — Telehealth: Payer: Self-pay | Admitting: Pediatric Intensive Care

## 2018-10-31 NOTE — Telephone Encounter (Signed)
Client verified by 2 identifiers. Client states recently moving from [place]. He has a history of PTSD, hypertension and recently kidney stones. He does not have access to medication for hypertension. He does not endorse any SI/HI on call. Complains of a headache and would like some medication for that. CN advised she would contact [facility] clinic for referral. Email to Lavinia Sharps FNP for referral. Shann Medal RN BSN CNP [phone]

## 2018-11-01 ENCOUNTER — Telehealth: Payer: Self-pay | Admitting: Pediatric Intensive Care

## 2018-11-01 NOTE — Telephone Encounter (Signed)
Left HIPAA compliant message for client to return call. Client has been referred to [facility] clinic and will need to complete intake packet in order to schedule appointment. Shann Medal RN BSN CNP [phone]

## 2018-11-04 NOTE — Progress Notes (Signed)
COVID Hotel Screening performed. Temperature, PHQ-9, and need for medical care and medications assessed. PHQ-9=3. Patient reports that he turned in prescriptions to [facility] and is still waiting for them. Referral made to [facility].  Carlyle Basques RN MSN

## 2018-11-14 NOTE — Congregational Nurse Program (Signed)
Closing encounter per request. 

## 2018-11-18 NOTE — Progress Notes (Signed)
COVID Hotel Screening performed. Temperature, PHQ-9, and need for medical care and medications assessed. Patient reports issues with depression. Scored 12 on the PHQ-9. Also complains of left side pain that hurts all day and keeps him from sleeping. [facility] has a list of medications that the patient is on, but he does not have any. Scheduled to see Lavinia Sharps on June 4th. Referral to Chales Abrahams to see if appointment can be moved up earlier.  Carlyle Basques RN MSN

## 2018-11-25 NOTE — Progress Notes (Addendum)
COVID Hotel Screening performed. Temperature, PHQ-9, and need for medical care and medications assessed. Patient's blood pressure is up and he has started experiencing headaches and dizziness.Patient states he does not have money to go to ED. Referral sent to [name].  Carlyle Basques RN MSN

## 2018-11-27 ENCOUNTER — Other Ambulatory Visit: Payer: Self-pay

## 2018-11-27 ENCOUNTER — Emergency Department (HOSPITAL_BASED_OUTPATIENT_CLINIC_OR_DEPARTMENT_OTHER)
Admission: EM | Admit: 2018-11-27 | Discharge: 2018-11-27 | Disposition: A | Discharge status: Home / Self Care | Payer: Self-pay | Attending: Emergency Medicine | Admitting: Emergency Medicine

## 2018-11-27 ENCOUNTER — Emergency Department (HOSPITAL_BASED_OUTPATIENT_CLINIC_OR_DEPARTMENT_OTHER): Payer: Self-pay

## 2018-11-27 ENCOUNTER — Encounter (HOSPITAL_BASED_OUTPATIENT_CLINIC_OR_DEPARTMENT_OTHER): Payer: Self-pay | Admitting: Emergency Medicine

## 2018-11-27 DIAGNOSIS — I10 Essential (primary) hypertension: Secondary | ICD-10-CM | POA: Insufficient documentation

## 2018-11-27 DIAGNOSIS — M545 Low back pain, unspecified: Secondary | ICD-10-CM

## 2018-11-27 DIAGNOSIS — F17210 Nicotine dependence, cigarettes, uncomplicated: Secondary | ICD-10-CM | POA: Insufficient documentation

## 2018-11-27 DIAGNOSIS — G8929 Other chronic pain: Secondary | ICD-10-CM | POA: Insufficient documentation

## 2018-11-27 DIAGNOSIS — M419 Scoliosis, unspecified: Secondary | ICD-10-CM | POA: Insufficient documentation

## 2018-11-27 HISTORY — DX: Essential (primary) hypertension: I10

## 2018-11-27 LAB — CBC WITH DIFFERENTIAL/PLATELET
Abs Immature Granulocytes: 0.03 10*3/uL (ref 0.00–0.07)
Basophils Absolute: 0 10*3/uL (ref 0.0–0.1)
Basophils Relative: 1 %
Eosinophils Absolute: 0.1 10*3/uL (ref 0.0–0.5)
Eosinophils Relative: 2 %
HCT: 46.6 % (ref 39.0–52.0)
Hemoglobin: 15.2 g/dL (ref 13.0–17.0)
Immature Granulocytes: 1 %
Lymphocytes Relative: 34 %
Lymphs Abs: 1.9 10*3/uL (ref 0.7–4.0)
MCH: 29.1 pg (ref 26.0–34.0)
MCHC: 32.6 g/dL (ref 30.0–36.0)
MCV: 89.1 fL (ref 80.0–100.0)
Monocytes Absolute: 0.5 10*3/uL (ref 0.1–1.0)
Monocytes Relative: 10 %
Neutro Abs: 3 10*3/uL (ref 1.7–7.7)
Neutrophils Relative %: 52 %
Platelets: 248 10*3/uL (ref 150–400)
RBC: 5.23 MIL/uL (ref 4.22–5.81)
RDW: 14.2 % (ref 11.5–15.5)
WBC: 5.5 10*3/uL (ref 4.0–10.5)
nRBC: 0 % (ref 0.0–0.2)

## 2018-11-27 LAB — URINALYSIS, ROUTINE W REFLEX MICROSCOPIC
Bilirubin Urine: NEGATIVE
Glucose, UA: NEGATIVE mg/dL
Ketones, ur: NEGATIVE mg/dL
Leukocytes,Ua: NEGATIVE
Nitrite: NEGATIVE
Protein, ur: 30 mg/dL — AB
Specific Gravity, Urine: 1.025 (ref 1.005–1.030)
pH: 6 (ref 5.0–8.0)

## 2018-11-27 LAB — COMPREHENSIVE METABOLIC PANEL
ALT: 22 U/L (ref 0–44)
AST: 25 U/L (ref 15–41)
Albumin: 4.5 g/dL (ref 3.5–5.0)
Alkaline Phosphatase: 92 U/L (ref 38–126)
Anion gap: 12 (ref 5–15)
BUN: 13 mg/dL (ref 6–20)
CO2: 21 mmol/L — ABNORMAL LOW (ref 22–32)
Calcium: 9.1 mg/dL (ref 8.9–10.3)
Chloride: 105 mmol/L (ref 98–111)
Creatinine, Ser: 0.98 mg/dL (ref 0.61–1.24)
GFR calc Af Amer: >60 mL/min (ref >60)
GFR calc non Af Amer: >60 mL/min (ref >60)
Glucose, Bld: 97 mg/dL (ref 70–99)
Potassium: 3.7 mmol/L (ref 3.5–5.1)
Sodium: 138 mmol/L (ref 135–145)
Total Bilirubin: 0.5 mg/dL (ref 0.3–1.2)
Total Protein: 8.1 g/dL (ref 6.5–8.1)

## 2018-11-27 LAB — URINALYSIS, MICROSCOPIC (REFLEX)

## 2018-11-27 MED ORDER — DICLOFENAC SODIUM 1 % TD GEL: 4.0000 g | Freq: Four times a day (QID) | TRANSDERMAL | 0 refills | Status: AC

## 2018-11-27 MED ORDER — METHOCARBAMOL 500 MG PO TABS: 500.0000 mg | ORAL_TABLET | Freq: Two times a day (BID) | ORAL | 0 refills | Status: AC

## 2018-11-27 MED ORDER — KETOROLAC TROMETHAMINE 30 MG/ML IJ SOLN
15.0000 mg | Freq: Once | INTRAMUSCULAR | Status: AC
  Administered 2018-11-27: 15 mg via INTRAVENOUS
  Filled 2018-11-27: qty 1

## 2018-11-27 MED ORDER — LIDOCAINE 5 % EX PTCH: 1.0000 | MEDICATED_PATCH | CUTANEOUS | 0 refills | Status: AC

## 2018-11-27 MED ORDER — SODIUM CHLORIDE 0.9 % IV BOLUS
1000.0000 mL | Freq: Once | INTRAVENOUS | Status: AC
  Administered 2018-11-27: 1000 mL via INTRAVENOUS

## 2018-11-27 MED ORDER — ONDANSETRON HCL 4 MG/2ML IJ SOLN
4.0000 mg | Freq: Once | INTRAMUSCULAR | Status: AC
  Administered 2018-11-27: 4 mg via INTRAVENOUS
  Filled 2018-11-27: qty 2

## 2018-11-27 NOTE — ED Triage Notes (Signed)
Pt to ED via [facility] w/ c/o LT flank pain x months

## 2018-11-27 NOTE — Progress Notes (Signed)
COVID Hotel Screening performed. Temperature, PHQ-9, and need for medical care and medications assessed.PHQ-9=13.  Patient continues to complain of left flank pain. Scores as 8 out of 10. Recommended that patient go to hospital as no response back from provider. Patient taken by ambulance to [facility] ED. Message sent to Chales Abrahams Placey to report event.  Carlyle Basques RN MSN

## 2018-11-27 NOTE — ED Provider Notes (Signed)
[facility] EMERGENCY DEPARTMENT Provider Note   CSN: [id number] Arrival date & time: 11/27/18  1133    History   Chief Complaint Chief Complaint  Patient presents with  . Flank Pain    HPI Kiyan Burmester is a 48 y.o. male.     HPI   Bane Hagy is a 48 y.o. male, patient with no pertinent past medical history, presenting to the ED with left lower back and flank pain beginning suddenly around 3 AM this morning. He describes the pain as "it feels like something is messed up and there, like a muscle or something," rated 7/10, radiating from the left lower back into the left flank, worse with twisting and bending.  Pain is waxing and waning.  Nausea and one episode of vomiting.  Patient starts associate his current pain with pain he has had intermittently for the last 3 years or so.  He also has an additional complaint of erectile dysfunction and inconsistent urine stream for the last several months to a year. Last bowel movement was this morning and was normal. Denies fever/chills, abdominal pain, chest pain, shortness of breath, hematuria, dysuria, testicular pain, scrotal swelling, pain with bowel movements, hematochezia/melena, diarrhea, or any other complaints.     Past Medical History:  Diagnosis Date  . Hypertension     There are no active problems to display for this patient.   History reviewed. No pertinent surgical history.      Home Medications    Prior to Admission medications   Medication Sig Start Date End Date Taking? Authorizing Provider  diclofenac sodium (VOLTAREN) 1 % GEL Apply 4 g topically 4 (four) times daily. 11/27/18   Teighan Aubert C, PA-C  lidocaine (LIDODERM) 5 % Place 1 patch onto the skin daily. Remove & Discard patch within 12 hours or as directed by MD 11/27/18   Aasir Daigler C, PA-C  methocarbamol (ROBAXIN) 500 MG tablet Take 1 tablet (500 mg total) by mouth 2 (two) times daily. 11/27/18   Robin Petrakis, Hillard Danker, PA-C    Family History  No family history on file.  Social History Social History   Tobacco Use  . Smoking status: Current Every Day Smoker  . Smokeless tobacco: Never Used  Substance Use Topics  . Alcohol use: Yes    Comment: social  . Drug use: Never     Allergies   Amlodipine and Lisinopril   Review of Systems Review of Systems  Constitutional: Negative for chills, diaphoresis and fever.  Respiratory: Negative for cough and shortness of breath.   Cardiovascular: Negative for chest pain and leg swelling.  Gastrointestinal: Positive for nausea and vomiting. Negative for abdominal pain, blood in stool, constipation and diarrhea.  Genitourinary: Positive for flank pain. Negative for dysuria, frequency, genital sores, hematuria, scrotal swelling and testicular pain.  Musculoskeletal: Positive for back pain. Negative for neck pain.  Neurological: Negative for dizziness, syncope, weakness and numbness.  All other systems reviewed and are negative.    Physical Exam Updated Vital Signs BP (!) 205/131 (BP Location: Left Arm)   Pulse 80   Temp 98.1 F (36.7 C) (Oral)   Resp 16   Ht  (1.803 m)   Wt 90.3 kg   SpO2 98%   BMI 27.75 kg/m   Physical Exam Vitals signs and nursing note reviewed.  Constitutional:      General: He is not in acute distress.    Appearance: He is well-developed. He is not diaphoretic.  HENT:  Head: Normocephalic and atraumatic.     Mouth/Throat:     Mouth: Mucous membranes are moist.     Pharynx: Oropharynx is clear.  Eyes:     Conjunctiva/sclera: Conjunctivae normal.  Neck:     Musculoskeletal: Neck supple.  Cardiovascular:     Rate and Rhythm: Normal rate and regular rhythm.     Pulses: Normal pulses.          Radial pulses are 2+ on the right side and 2+ on the left side.       Posterior tibial pulses are 2+ on the right side and 2+ on the left side.     Heart sounds: Normal heart sounds.     Comments: Tactile temperature in the extremities  appropriate and equal bilaterally. Pulmonary:     Effort: Pulmonary effort is normal. No respiratory distress.     Breath sounds: Normal breath sounds.  Abdominal:     Palpations: Abdomen is soft.     Tenderness: There is no abdominal tenderness. There is left CVA tenderness. There is no guarding.     Comments: No tenderness in the left flank or abdomen.  Musculoskeletal:       Back:     Right lower leg: No edema.     Left lower leg: No edema.  Lymphadenopathy:     Cervical: No cervical adenopathy.  Skin:    General: Skin is warm and dry.  Neurological:     Mental Status: He is alert.     Comments: Sensation grossly intact to light touch in the extremities.  Grip strengths equal bilaterally.  Strength 5/5 in all extremities. No gait disturbance. Coordination intact. Cranial nerves III-XII grossly intact. No facial droop.   Psychiatric:        Mood and Affect: Mood and affect normal.        Speech: Speech normal.        Behavior: Behavior normal.      ED Treatments / Results  Labs (all labs ordered are listed, but only abnormal results are displayed) Labs Reviewed  URINALYSIS, ROUTINE W REFLEX MICROSCOPIC - Abnormal; Notable for the following components:      Result Value   Hgb urine dipstick TRACE (*)    Protein, ur 30 (*)    All other components within normal limits  COMPREHENSIVE METABOLIC PANEL - Abnormal; Notable for the following components:   CO2 21 (*)    All other components within normal limits  URINALYSIS, MICROSCOPIC (REFLEX) - Abnormal; Notable for the following components:   Bacteria, UA MANY (*)    All other components within normal limits  CBC WITH DIFFERENTIAL/PLATELET    EKG None  Radiology Ct Renal Stone Study  Result Date: 11/27/2018 CLINICAL DATA:  Chronic left-sided flank pain, worse with movement. EXAM: CT ABDOMEN AND PELVIS WITHOUT CONTRAST TECHNIQUE: Multidetector CT imaging of the abdomen and pelvis was performed following the standard  protocol without IV contrast. COMPARISON:  None. FINDINGS: The lack of intravenous contrast limits the ability to evaluate solid abdominal organs. Lower chest: Limited visualization of the lower thorax demonstrates minimal dependent subpleural ground-glass atelectasis. No discrete focal airspace opacities. No pleural effusion. Normal heart size.  No pericardial effusion. Hepatobiliary: Normal hepatic contour. Normal noncontrast appearance of the gallbladder given degree distention. No radiopaque gallstones. No ascites. Pancreas: Normal noncontrast appearance of the pancreas. Spleen: Normal noncontrast appearance of the spleen. Adrenals/Urinary Tract: Normal noncontrast appearance of the bilateral kidneys. No renal stones. No renal stones are seen along  the expected course of either ureter or the urinary bladder. There is mild circumferential thickening of the urinary bladder wall, likely total to underdistention. No urine obstruction or perinephric stranding Normal appearance of the bilateral adrenal glands. Stomach/Bowel: Scattered colonic diverticulosis without evidence of superimposed acute diverticulitis on this noncontrast examination. The cecum is noted to be located within the right mid hemiabdomen. Normal noncontrast appearance of the terminal ileum and the appendix. No pneumoperitoneum, pneumatosis or portal venous gas. Vascular/Lymphatic: Atherosclerotic plaque within a normal caliber abdominal aorta. No bulky retroperitoneal, mesenteric, pelvic or inguinal lymphadenopathy on this noncontrast examination. Reproductive: Normal noncontrast appearance of the pelvic organs. No free fluid in the pelvic cul-de-sac. Other: Regional soft tissues appear normal. Musculoskeletal: No acute or aggressive osseous abnormalities. 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 Moderate DDD involving the right-side of the L3-L4 intervertebral disc  space. IMPRESSION: 1. 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 2. Otherwise, no explanation for patient's chronic left-sided flank pain. Specifically, no evidence of nephrolithiasis, urinary or enteric obstruction. 3. Colonic diverticulosis without evidence of superimposed acute diverticulitis. 4. Aortic Atherosclerosis (ICD10-I70.0). Electronically Signed   By: Simonne ComeJohn  Watts M.D.   On: 11/27/2018 12:34    Procedures Procedures (including critical care time)  Medications Ordered in ED Medications  sodium chloride 0.9 % bolus 1,000 mL (1,000 mLs Intravenous New Bag/Given 11/27/18 1206)  ketorolac (TORADOL) 30 MG/ML injection 15 mg (15 mg Intravenous Given 11/27/18 1209)  ondansetron (ZOFRAN) injection 4 mg (4 mg Intravenous Given 11/27/18 1208)     Initial Impression / Assessment and Plan / ED Course  I have reviewed the triage vital signs and the nursing notes.  Pertinent labs & imaging results that were available during my care of the patient were reviewed by me and considered in my medical decision making (see chart for details).  Clinical Course as of Nov 26 1317  Sun Nov 27, 2018  1255 Patient denies any urinary symptoms consistent with infection.  Bacteria, UA(!): MANY [SJ]  1317 Patient appears to be asymptomatic to his hypertension at this time.  Renal function is normal.  He has no chest complaints.  No neurologic deficits.  Per current ACEP guidelines, no treatment initiated for his blood pressure at this time.  Patient states he has PCP appointment set up for June 4 to assess this issue.  BP(!): 205/131 [SJ]    Clinical Course User Index [SJ] Roselynne Lortz C, PA-C       Patient presents with lower back pain. Patient is nontoxic appearing, afebrile, not tachycardic, not tachypneic, not hypotensive, maintains excellent SPO2 on room air, and is in no apparent distress.  Lab results reassuring.  CT shows scoliosis  with DDD.  Suspect this, along with muscle spasms, to be a possible source of patient's pain. When I told patient about the scoliosis, he states, "Yeah, I thought maybe it could be pain from that, but wanted to make sure." Alternative diagnoses were considered including, but not limited to, dissection, ischemia, infarct, but thought less likely based on patient presentation, pain onset and description, physical exam findings, and consideration of risk factors.  Pain-free at time of discharge. The patient was given instructions for home care as well as return precautions. Patient voices understanding of these instructions, accepts the plan, and is comfortable with discharge.   Findings and plan of care discussed with Virgina NorfolkAdam Curatolo, MD.  Final Clinical Impressions(s) / ED Diagnoses   Final diagnoses:  Chronic left-sided low back pain without sciatica  Scoliosis of thoracolumbar spine, unspecified scoliosis type    ED Discharge Orders         Ordered    methocarbamol (ROBAXIN) 500 MG tablet  2 times daily     11/27/18 1302    lidocaine (LIDODERM) 5 %  Every 24 hours     11/27/18 1302    diclofenac sodium (VOLTAREN) 1 % GEL  4 times daily     11/27/18 1302           Anselm Pancoast, PA-C 11/27/18 1322    Virgina Norfolk, DO 11/27/18 1349

## 2018-11-27 NOTE — Discharge Instructions (Addendum)
Expect your soreness to increase over the next 2-3 days. Take it easy, but do not lay around too much as this may make any stiffness worse.  Antiinflammatory medications: Take 600 mg of ibuprofen every 6 hours or 440 mg (over the counter dose) to 500 mg (prescription dose) of naproxen every 12 hours for the next 3 days. After this time, these medications may be used as needed for pain. Take these medications with food to avoid upset stomach. Choose only one of these medications, do not take them together. Acetaminophen (generic for Tylenol): Should you continue to have additional pain while taking the ibuprofen or naproxen, you may add in acetaminophen as needed. Your daily total maximum amount of acetaminophen from all sources should be limited to 4000mg /day for persons without liver problems, or 2000mg /day for those with liver problems. Diclofenac gel: This is a topical anti-inflammatory medication and can be applied directly to the painful region.  Do not use on the face or genitals.  This medication may be used as an alternative to oral anti-inflammatory medications, such as ibuprofen or naproxen. Methocarbamol: Methocarbamol (generic for Robaxin) is a muscle relaxer and can help relieve stiff muscles or muscle spasms.  Do not drive or perform other dangerous activities while taking this medication as it can cause drowsiness as well as changes in reaction time and judgement. Lidocaine patches: These are available via either prescription or over-the-counter. The over-the-counter option may be more economical one and are likely just as effective. There are multiple over-the-counter brands, such as Salonpas. Exercises: Be sure to perform the attached exercises starting with three times a week and working up to performing them daily. This is an essential part of preventing long term problems.  Follow up: Follow up with a primary care provider or the orthopedist for any future management of these complaints.  Be sure to follow up within 7-10 days. Return: Return to the ED should symptoms worsen.  For prescription assistance, may try using prescription discount sites or apps, such as goodrx.com  For the erectile and urine stream complaints, follow up with a primary care provider or urologist.

## 2018-12-02 NOTE — Progress Notes (Signed)
COVID Hotel Screening performed. Temperature, PHQ-9, and need for medical care and medications assessed. No additional needs assessed at this time.  Amiria Orrison RN MSN 

## 2018-12-06 ENCOUNTER — Telehealth: Payer: Self-pay | Admitting: *Deleted

## 2018-12-06 DIAGNOSIS — Z20822 Contact with and (suspected) exposure to covid-19: Secondary | ICD-10-CM

## 2018-12-06 NOTE — Telephone Encounter (Signed)
Order placed for COVID-19 testing.  

## 2018-12-09 NOTE — Progress Notes (Signed)
COVID Hotel Screening performed. Temperature, PHQ-9, and need for medical care and medications assessed. Patient agreed to the COVID-19 testing. No additional needs assessed at this time.  Ladene Allocca RN MSN 

## 2018-12-14 LAB — NOVEL CORONAVIRUS, NAA: SARS-CoV-2, NAA: NOT DETECTED

## 2018-12-15 ENCOUNTER — Encounter: Payer: Self-pay | Admitting: *Deleted

## 2018-12-15 ENCOUNTER — Telehealth: Payer: Self-pay

## 2018-12-15 NOTE — Telephone Encounter (Signed)
Attempted to give patient COVID results. LVM to call back.  

## 2021-04-04 IMAGING — CT CT RENAL STONE PROTOCOL
2 of 4 series · 16 of 46 positions shown, 18 images · non-contrast
Comparison: None.

CLINICAL DATA: Chronic left-sided flank pain, worse with movement.

EXAM:
CT ABDOMEN AND PELVIS WITHOUT CONTRAST
TECHNIQUE: Multidetector CT imaging of the abdomen and pelvis was performed
following the standard protocol without IV contrast.

[Series 2: axial st · axial · 0.86mm/px · z∈[-519,-99]mm · 13 of 92 slices shown, 15 images]
[im 4/92  soft-tissue]
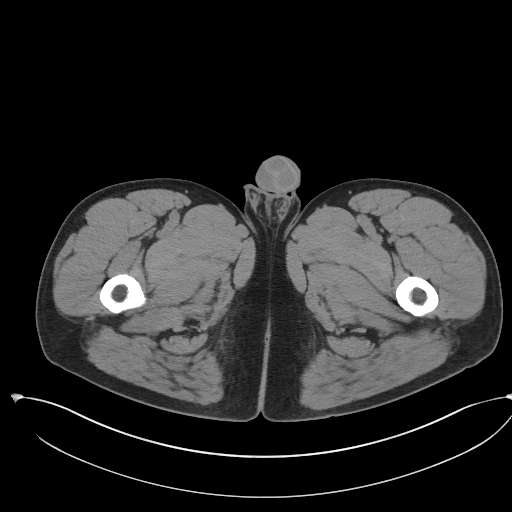
[im 4/92  bone]
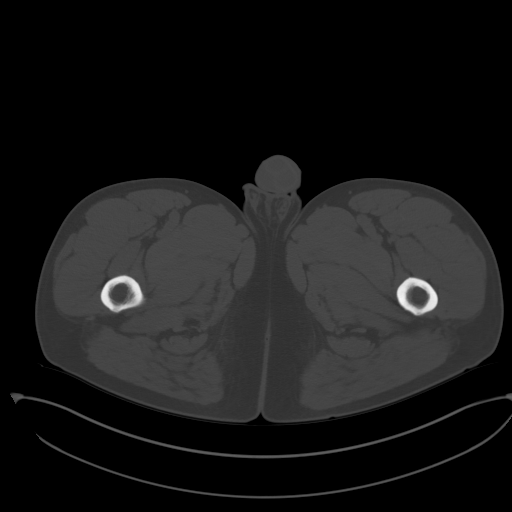
[im 11/92  soft-tissue]
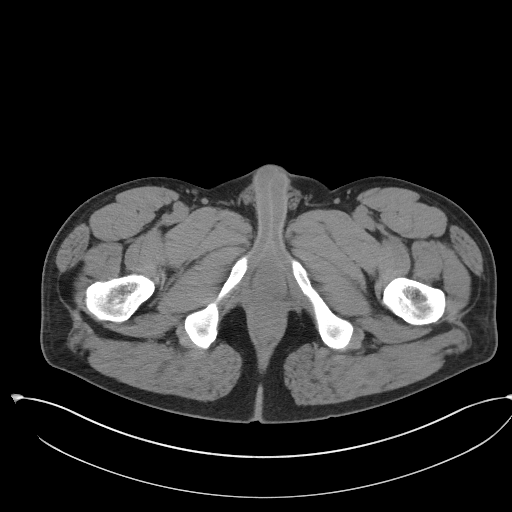
[im 18/92  soft-tissue]
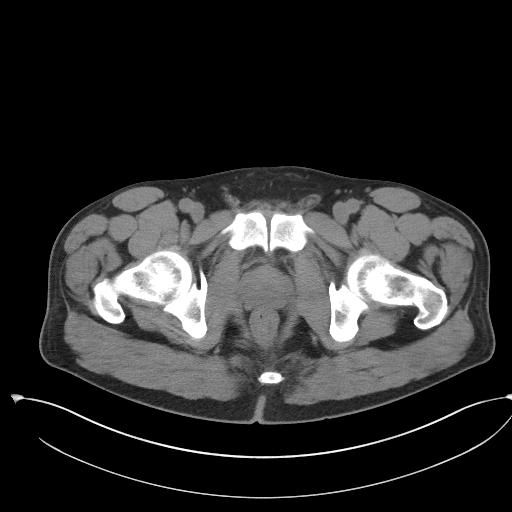
[im 25/92  soft-tissue]
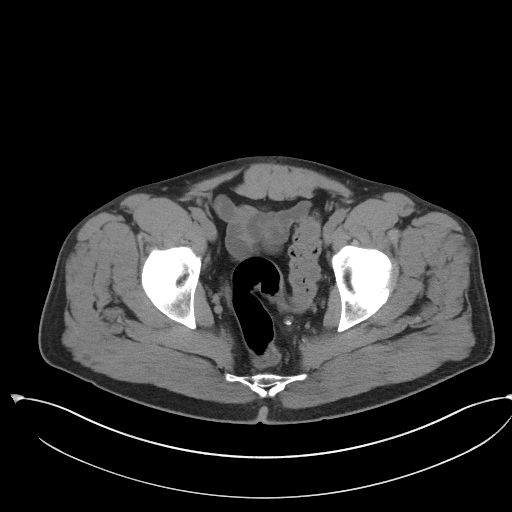
[im 32/92  soft-tissue]
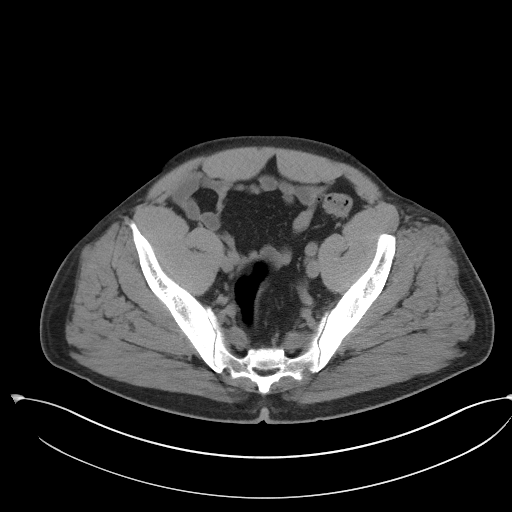
[im 39/92  soft-tissue]
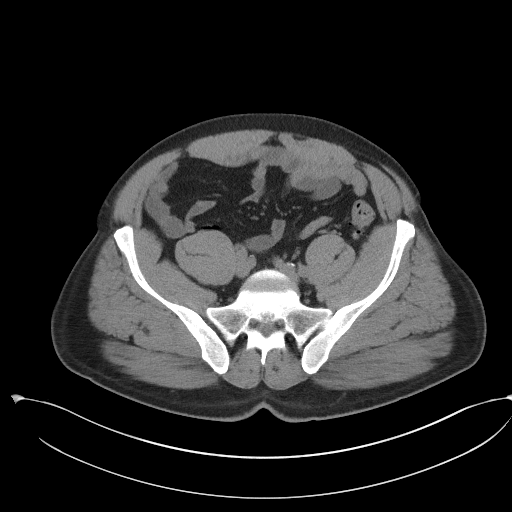
[im 46/92  soft-tissue]
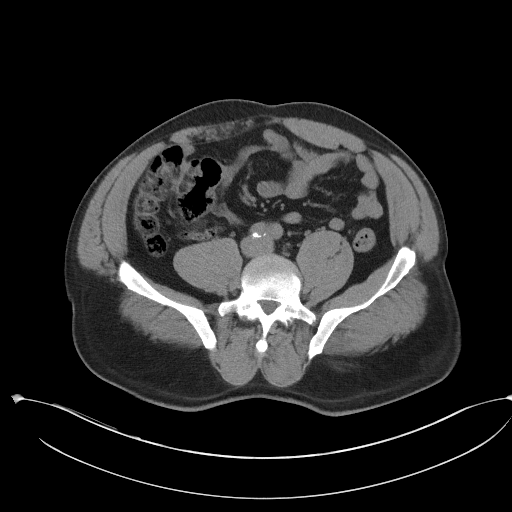
[im 53/92  soft-tissue]
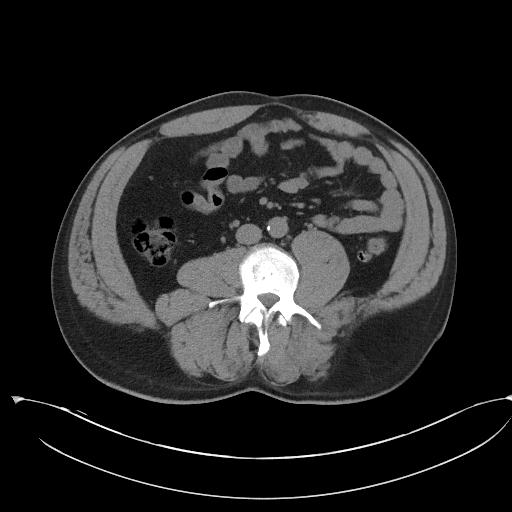
[im 60/92  soft-tissue]
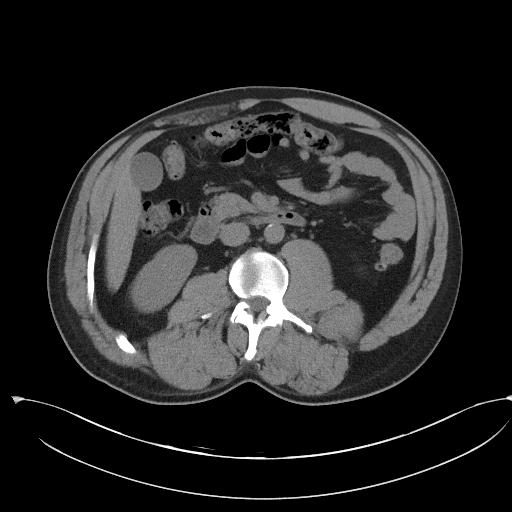
[im 60/92  bone]
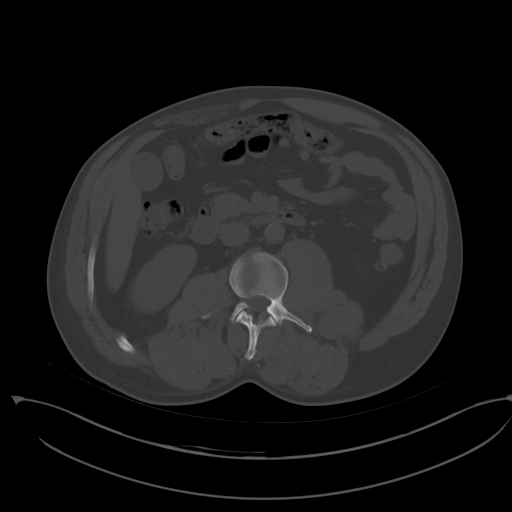
[im 67/92  soft-tissue]
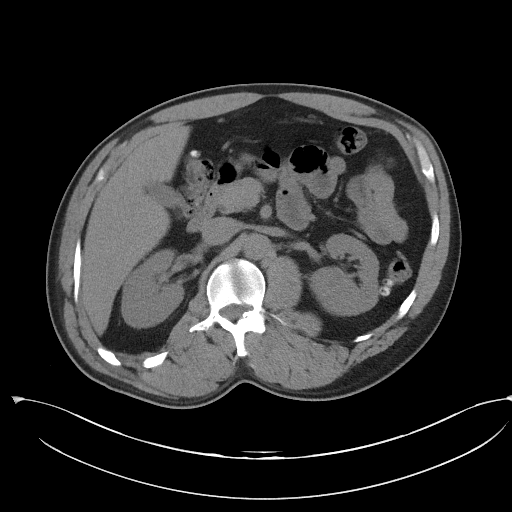
[im 74/92  soft-tissue]
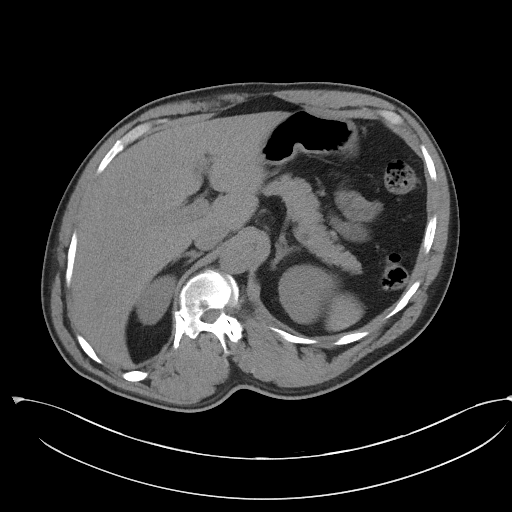
[im 81/92  soft-tissue]
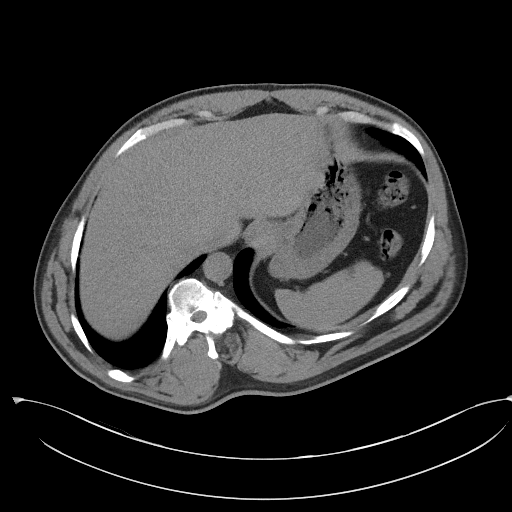
[im 88/92  soft-tissue]
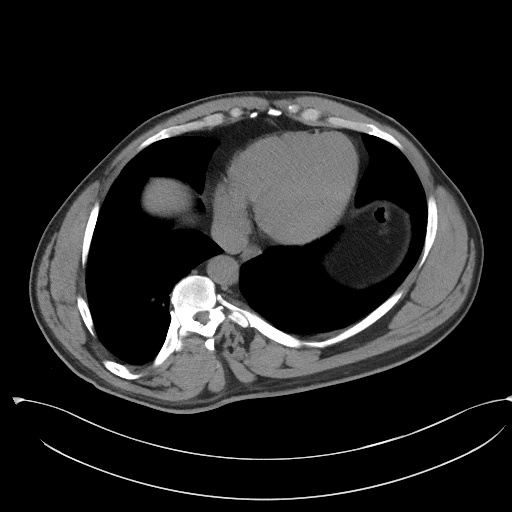

[Series 5: coronal st · coronal · 0.86mm/px · 3 of 94 slices shown]
[im 32/94  soft-tissue]
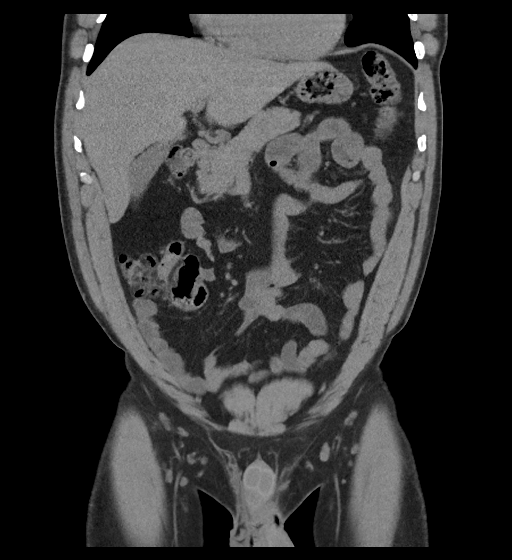
[im 42/94  soft-tissue]
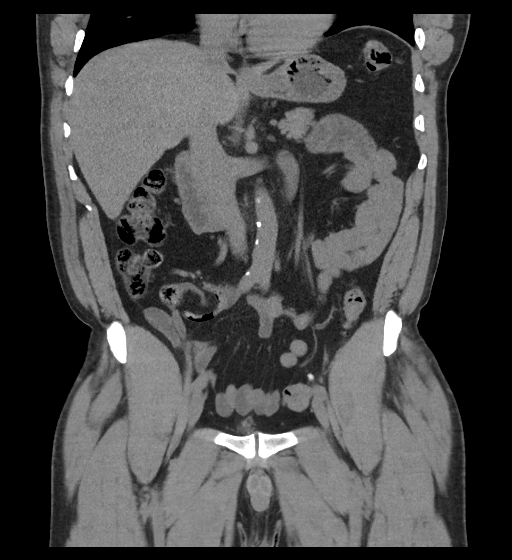
[im 52/94  soft-tissue]
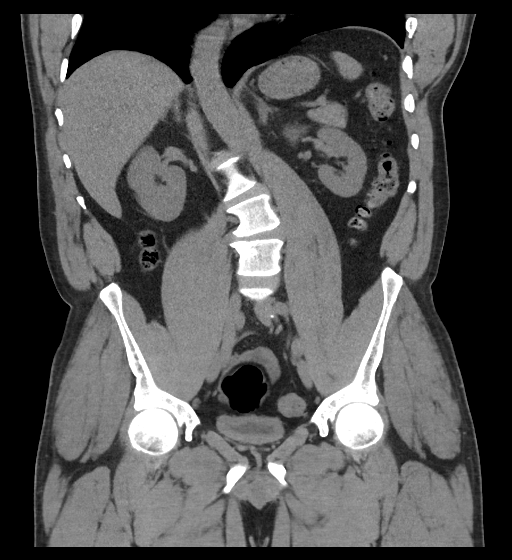

[16 of 46 positions shown; findings below may reference images not displayed]

FINDINGS: The lack of intravenous contrast limits the ability to evaluate
solid abdominal organs.

Lower chest: Limited visualization of the lower thorax demonstrates
minimal dependent subpleural ground-glass atelectasis. No discrete
focal airspace opacities. No pleural effusion.

Normal heart size.  No pericardial effusion.

Hepatobiliary: Normal hepatic contour. Normal noncontrast appearance
of the gallbladder given degree distention. No radiopaque
gallstones. No ascites.

Pancreas: Normal noncontrast appearance of the pancreas.

Spleen: Normal noncontrast appearance of the spleen.

Adrenals/Urinary Tract: Normal noncontrast appearance of the
bilateral kidneys. No renal stones. No renal stones are seen along
the expected course of either ureter or the urinary bladder. There
is mild circumferential thickening of the urinary bladder wall,
likely total to underdistention. No urine obstruction or perinephric
stranding

Normal appearance of the bilateral adrenal glands.

Stomach/Bowel: Scattered colonic diverticulosis without evidence of
superimposed acute diverticulitis on this noncontrast examination.
The cecum is noted to be located within the right mid hemiabdomen.
Normal noncontrast appearance of the terminal ileum and the
appendix. No pneumoperitoneum, pneumatosis or portal venous gas.

Vascular/Lymphatic: Atherosclerotic plaque within a normal caliber
abdominal aorta.

No bulky retroperitoneal, mesenteric, pelvic or inguinal
lymphadenopathy on this noncontrast examination.

Reproductive: Normal noncontrast appearance of the pelvic organs. No
free fluid in the pelvic cul-de-sac.

Other: Regional soft tissues appear normal.

Musculoskeletal: No acute or aggressive osseous abnormalities.
Moderate to severe rotatory scoliotic curvature of the thoracolumbar
spine with dominant mid component convex to the right. Moderate DDD
involving the right-side of the L3-L4 intervertebral disc space.
IMPRESSION: 1. Moderate to severe rotatory scoliotic curvature of the
thoracolumbar spine with associated moderate DDD of L3-L4.
2. Otherwise, no explanation for patient's chronic left-sided flank
pain. Specifically, no evidence of nephrolithiasis, urinary or
enteric obstruction.
3. Colonic diverticulosis without evidence of superimposed acute
diverticulitis.
4. Aortic Atherosclerosis (DQUN2-QA1.1).

## 2023-05-13 NOTE — H&P (Addendum)
 [facility]    History and Physical     Assessment   Active Hospital Problems    *Chest pain      Hyperlipidemia      Tobacco abuse      Elevated troponin      HTN (hypertension)    CAD w STEMI 100% LAD occlusion 02/05/23  s/p angioplasty/ stent    (DES Onyx) at [facility] in [place]     L inguinal hernia    Plan   Chest pain  Tele  Trop (cycle)  Check hga1c, lipid  Check cardiac echo   Start aspirin 81mg  daily  Start Atorvastatin 80mg  qhs  Cont NTP 1 inch topically q8h  NPO  Nuclear stress test r/o ischemia  Cardiology consulted for AM, appreciate Dr. Tanda input    Hypertension  Start carvedilol 3.125mg  bid    Hyperlipidemia  Start Atorvastatin as above    Tobacco dep  Encourage smoking cessation    L inguinal hernia  surgery consulted, appreciate input    DVT prophylaxis: lovenox  FULL CODE  Dispo- home, lives with stepson    Expected length of stay less than 2 midnights based on medical complexity including chest pain in setting of hx of STEMI 02/05/23 requiring cardiac stent, may require inpatient > 2 nites stay depending upon w/up results     Fluid and electrolyte disorder: Euvolemic, present on admission   Plan: Repeat BMP in AM    Abnormal blood counts: Anemia, present on admission  Plan: Repeat CBC in AM    Nutritional status: Body mass index is 27.06 kg/m. Overweight  Plan: Counseled on weight NPO time specified Except: NPO Comments:, Meds    Coagulation defect: Not present, present on admission  Plan: N/A    Elevated LFTs/transaminitis due to: Not present, present on admission  Plan: Repeat CMP in AM    Debility: Not present, present on admission  Plan: Encourage OOB/mobility             History   Christopher Cline is a 52 y.o. male hypertension, hyperlipidemia, CAD s/p STEMI 02/05/23 requiring angioplasty and DES (onyx) , Tobacco dep, + UDS for fentanyl in the past, who presents with chest pain starting Monday morning at about 10 AM.  Is been intermittent since then.  Patient  notes associated shortness of breath as well as diaphoresis and nausea.  Patient also notes lightheadedness.  Patient describes the chest pain as substernal sharp radiating to the right and left arm.  And increasing in intensity yesterday and therefore causing him to present to the ED for evaluation.    Patient also notes that his left inguinal hernia has been bothering him ever since he had some type of abdominal surgery about 5 to 6 months ago at [facility].    In ED temperature 98.2 pulse 101 blood pressure 172/104, pulse ox 96% on room air    WBC 8.7, hemoglobin 13.6 platelet count 218  Sodium 139, potassium 3.8, BUN 18, creatinine 1.41, glucose 110    AST 27, ALT 17    Troponin 29>>>> 29>>>> 28    BNP 873    EKG sinus tach at 100, normal axis, normal PR, prolonged QTc, left atrial enlargement, ST elevation in V3-4, when reviewing old EKG from July ST segment elevation was also present.    Chest x-ray-no acute process      Patient will be admitted for workup of chest pain with elevated troponin  Past Medical History:   Diagnosis Date    Hypertension     Scoliosis      Past Surgical History:   Procedure Laterality Date    Ankle fracture surgery        Allergies   Allergen Reactions    Amlodipine Anaphylaxis    Lisinopril Anaphylaxis     Prior to Admission medications    Not on File     Social History     Socioeconomic History    Marital status: Single   Tobacco Use    Smoking status: Every Day     Packs/day: .5     Types: Cigarettes   Substance and Sexual Activity    Alcohol use: Yes     Alcohol/week: 3.0 standard drinks of alcohol     Types: 3 Cans of beer per week    Drug use: No     Family History   Problem Relation Age of Onset    Hypertension Mother     Hypertension Father      Review of Systems   Constitutional:  Positive for diaphoresis.   HENT: Negative.     Eyes: Negative.    Respiratory:  Positive for shortness of breath.    Cardiovascular:  Positive for chest pain.    Gastrointestinal:  Positive for nausea.   Endocrine: Negative.    Genitourinary: Negative.    Musculoskeletal: Negative.    Skin: Negative.    Allergic/Immunologic: Negative.    Neurological: Negative.    Hematological: Negative.    Psychiatric/Behavioral: Negative.               Physical Examination   Temp:  [98.2 F (36.8 C)-98.4 F (36.9 C)] 98.4 F (36.9 C)  Heart Rate:  [87-101] 87  Resp:  [20] 20  BP: (147-172)/(90-104) 147/90  SpO2:  [96 %-97 %] 97 %  Pain Score:   8  O2 Device: None (Room air)       Physical Exam  Constitutional:       Appearance: Normal appearance.   HENT:      Head: Normocephalic and atraumatic.      Right Ear: External ear normal.      Left Ear: External ear normal.      Nose: Nose normal.      Mouth/Throat:      Mouth: Mucous membranes are moist.   Eyes:      Extraocular Movements: Extraocular movements intact.      Conjunctiva/sclera: Conjunctivae normal.      Pupils: Pupils are equal, round, and reactive to light.   Cardiovascular:      Rate and Rhythm: Normal rate and regular rhythm.      Pulses: Normal pulses.      Heart sounds: Normal heart sounds.   Musculoskeletal:         General: Normal range of motion.      Cervical back: Normal range of motion and neck supple.   Pulmonary:      Effort: Pulmonary effort is normal.      Breath sounds: Normal breath sounds.   Abdominal:      General: Bowel sounds are normal.      Palpations: Abdomen is soft.      Comments: Nontender, left inguinal hernia present   Skin:     General: Skin is warm and dry.      Capillary Refill: Capillary refill takes less than 2 seconds.   Neurological:      General: No focal  deficit present.      Mental Status: He is alert and oriented to person, place, and time.   Psychiatric:         Mood and Affect: Mood normal.         Behavior: Behavior normal.       Results   Labs:   Recent Results (from the past 24 hour(s))   CBC And Differential    Collection Time: 05/13/23  1:30 AM   Result Value Ref Range    WBC  8.7 4.0 - 10.5 thou/mcL    RBC 4.71 4.63 - 6.08 million/mcL    HGB 13.6 (L) 13.7 - 17.5 gm/dL    HCT 57.4 59.8 - 48.9 %    MCV 90.2 79.0 - 92.2 fL    MCH 28.9 25.7 - 32.2 pg    MCHC 32.0 (L) 32.3 - 36.5 gm/dL    Plt Ct 781 849 - 599 thou/mcL    RDW SD 49.3 (H) 35.1 - 46.3 fL    MPV 10.5 9.4 - 12.4 fL    NRBC% 0.0 0.0 - 0.2 /100WBC    Absolute NRBC Count 0.00 0.00 - 0.01 thou/mcL    NEUTROPHIL % 70.5 %    LYMPHOCYTE % 15.7 %    MONOCYTE % 12.1 %    Eosinophil % 1.3 %    BASOPHIL % 0.2 %    IG% 0.2 %    ABSOLUTE NEUTROPHIL COUNT 6.11 1.50 - 7.50 thou/mcL    ABSOLUTE LYMPHOCYTE COUNT 1.36 1.00 - 4.50 thou/mcL    Absolute Monocyte Count 1.05 (H) 0.10 - 0.80 thou/mcL    Absolute Eosinophil Count 0.11 0.00 - 0.50 thou/mcL    Absolute Basophil Count 0.02 0.00 - 0.20 thou/mcL    Absolute Immature Granulocyte Count 0.02 0.00 - 0.03 thou/mcL    Comprehensive Metabolic Panel    Collection Time: 05/13/23  1:30 AM   Result Value Ref Range    Na 139 136 - 146 mmol/L    Potassium 3.8 3.7 - 5.4 mmol/L    Cl 103 97 - 108 mmol/L    CO2 23 20 - 32 mmol/L    AGAP 13 7 - 16 mmol/L    Glucose 110 (H) 65 - 99 mg/dL    BUN 18 6 - 24 mg/dL    Creatinine 8.58 (H) 0.76 - 1.27 mg/dL    Ca 9.1 8.7 - 89.7 mg/dL    ALK PHOS 885 25 - 849 U/L    T Bili 0.43 0.00 - 1.20 mg/dL    Total Protein 7.2 6.0 - 8.5 gm/dL    Alb 4.1 3.5 - 5.5 gm/dL    GLOBULIN 3.1 1.5 - 4.5 gm/dL    ALBUMIN/GLOBULIN RATIO 1.3 1.1 - 2.5    BUN/CREAT RATIO 12.8 11.0 - 26.0    ALT 17 0 - 55 U/L    AST 27 0 - 40 U/L    eGFR 60 >=60 mL/min/1.75m2   Gen5 Cardiac Troponin T (TnT5) Baseline Series at: baseline, 1 hour, and 3 hour; Onset of symptoms: Greater than or equal 3 hours    Collection Time: 05/13/23  1:30 AM   Result Value Ref Range    TnT-Gen5 (0hr) 29 (H) <22 ng/L   Magnesium    Collection Time: 05/13/23  1:30 AM   Result Value Ref Range    Mg 2.1 1.6 - 2.6 mg/dL   NT-proBNP    Collection Time: 05/13/23  1:30 AM   Result Value Ref Range  NT-ProBNP 873 <=899 pg/mL   Gen5  Cardiac Troponin T (TnT5) 1H    Collection Time: 05/13/23  3:40 AM   Result Value Ref Range    TnT-Gen5 (1hr) 29 (H) <22 ng/L    Delta 1 Hour 0 <5 ng/L   Gen5 Cardiac Troponin T (TnT5) 3H    Collection Time: 05/13/23  4:45 AM   Result Value Ref Range    TnT-Gen5 (3hr) 28 (H) <22 ng/L    Delta 3 Hour -1 <7 ng/L   Ethanol    Collection Time: 05/13/23  4:45 AM   Result Value Ref Range    Ethanol <10 0 mg/dL     Imaging:  XR Chest Ap Portable    Result Date: 05/13/2023  SINGLE CHEST RADIOGRAPH: PROVIDED CLINICAL INDICATION: Chest Pain Shortness of breath ADDITIONAL CLINICAL INDICATION: None available COMPARISON: None available FINDINGS: Mild cardiac enlargement.  Lungs are clear.  No mass or consolidation.  Negative for pleural fluid or pneumothorax.  Prominent dextroscoliosis of the lower thoracic spine.  Osseous thorax is otherwise unremarkable.     IMPRESSION: Cardiomegaly without CHF.  Clear lungs.  Prominent scoliosis. Electronically Signed by: Elouise Gerlach, MD on 05/13/2023 1:33 AM   ECG:  ECG 12 lead    Result Date: 05/13/2023  Diagnosis Class Abnormal Acquisition Device MV360 Ventricular Rate 79 Atrial Rate 79 P-R Interval 196 QRS Duration 104 Q-T Interval 418 QTC Calculation(Bazett) 479 Calculated P Axis 52 Calculated R Axis -46 Calculated T Axis 85 Diagnosis Normal sinus rhythm Left anterior fascicular block Minimal voltage criteria for LVH, may be normal variant ( [employer] product ) Possible Lateral infarct (cited on or before 13-May-2023) Inferior infarct (cited on or before 13-May-2023) Abnormal ECG When compared with ECG of 13-May-2023 03:31, No significant change was found    ECG 12 lead    Result Date: 05/13/2023  Diagnosis Class Abnormal Acquisition Device MV360 Ventricular Rate 96 Atrial Rate 96 P-R Interval 180 QRS Duration 92 Q-T Interval 406 QTC Calculation(Bazett) 512 Calculated P Axis 61 Calculated R Axis -40 Calculated T Axis 80 Diagnosis Normal sinus rhythm Left axis deviation Minimal voltage  criteria for LVH, may be normal variant ( [employer] product ) Possible Lateral infarct (cited on or before 13-May-2023) Inferior infarct (cited on or before 13-May-2023) Prolonged QT Abnormal ECG When compared with ECG of 13-May-2023 01:19, QRS axis shifted left Mac Cancer ([id number]) on 05/13/2023 3:51:56 AM certifies that he/she has reviewed the ECG tracing and confirms the independent  interpretation is correct.    ECG 12 lead    Result Date: 05/13/2023  Diagnosis Class Abnormal Acquisition Device MV360 Ventricular Rate 102 Atrial Rate 102 P-R Interval 172 QRS Duration 92 Q-T Interval 384 QTC Calculation(Bazett) 500 Calculated P Axis 55 Calculated R Axis 22 Calculated T Axis 67 Diagnosis Sinus tachycardia Minimal voltage criteria for LVH, may be normal variant ( [employer] product ) Possible Lateral infarct , age undetermined Possible Inferior infarct , age undetermined Abnormal ECG When compared with ECG of 04-Jan-2015 08:57, Borderline criteria for Inferior infarct are now present QT has lengthened No significant change Mac Cancer ([id number]) on 05/13/2023 3:51:46 AM certifies that he/she has reviewed the ECG tracing and confirms the independent  interpretation is correct.     MDM  Reviewed: previous chart, nursing note and vitals  Reviewed previous: labs  Interpretation: labs, ECG and x-ray        Electronically signed:  Lynwood CINDERELLA Cramp, MD  05/13/2023 / 5:51 AM

## 2023-05-14 NOTE — Discharge Summary (Signed)
 [facility]    [facility] Inpatient Discharge Summary    PCP: No primary care provider on file.  Discharge Details     Admit date:         05/13/2023  Discharge date:        05/14/2023   Hospital Days:    1 days    Code Status:   Full Code  Advanced Directives on file: No Directive           Discharge Diagnoses:   Principal Problem:    Chest pain  Active Problems:    HTN (hypertension)    Hyperlipidemia    Tobacco abuse    Elevated troponin    Chest pain, unspecified type    Left inguinal hernia    Coronary artery disease involving native coronary artery of native heart     Task list for follow-up:  Follow up with PCP and Cardiology      Follow-Up Appointments Suggested:  Christopher LOISE Blush, Cline  [address]  [address]  [place] [address]  [phone]    Follow up in 1 week(s)      Ambulatory referral to Cardiology          Follow-Up Appointments Already Scheduled:  Future Appointments   Date Time Provider Department Center   07/19/2023  9:00 AM Christopher Cline [facility] None       Discharge Medications:  Current Discharge Medication List        DISCONTINUED medications       metoprolol tartrate (LOPRESSOR) 50 mg tablet        Multiple Vitamin (MULTIVITAMIN) tablet        Multiple Vitamins-Minerals (ONCOVITE) TABS            NEW medications    Details   carvedilol (COREG) 6.25 mg tablet Take one tablet (6.25 mg dose) by mouth 2 (two) times daily.  Start date: 05/14/2023      isosorbide mononitrate (IMDUR) 30 mg 24 hr tablet Take one tablet (30 mg dose) by mouth daily.  Start date: 05/14/2023      losartan potassium (COZAAR) 50 mg tablet Take one tablet (50 mg dose) by mouth daily.  Start date: 05/15/2023      spironolactone (ALDACTONE) 25 mg tablet Take one half tablet (12.5 mg dose) by mouth daily.  Start date: 05/15/2023      ticagrelor (BRILINTA) 90 mg tablet Take one tablet (90 mg dose) by mouth 2 (two) times daily.  Start date: 05/14/2023           CHANGED medications    Details   aspirin  (ECOTRIN LOW DOSE) EC tablet Take one tablet (81 mg dose) by mouth daily for 30 days.  Start date: 05/14/2023, End date: 06/13/2023      atorvastatin (LIPITOR) 80 mg tablet Take one tablet (80 mg dose) by mouth daily.  Start date: 05/14/2023      folic acid 1 mg tablet Take one tablet (1 mg dose) by mouth daily for 30 days.  Start date: 05/14/2023, End date: 06/13/2023      Thiamine Mononitrate 100 MG TABS Take one tablet (100 mg dose) by mouth daily for 30 days.  Start date: 05/14/2023, End date: 06/13/2023             Allergies:  Allergies   Allergen Reactions    Amlodipine Anaphylaxis    Lisinopril Anaphylaxis       Consultations this Admission:  IP  CONSULT TO GENERAL SURGERY  IP CONSULT TO CARDIOLOGY  IP CONSULT TO CASE MANAGEMENT, RN/SW  IP CONSULT TO PEER SUPPORT SPECIALIST    Procedures/Imaging:         NM Heart Spect Ph Stress (R)   Final Result   IMPRESSION:     1. MPI RISK ASSESSMENT: High risk (JACC 2009;53(6):530-553)   2. Multifocal infarcts.   3.  Dilated cardiomyopathy with markedly diminished LVEF.   4.  No ischemia.      Electronically Signed by: Christopher Quillin, Cline on 05/13/2023 7:28 PM      Echocardiogram Complete W Enhancing Agent   Final Result   Addendum (preliminary) 1 of 1   Mild to moderately reduced LVEF, 40-45% with hypokinesis of mid to distal    anterolateral and apical lateral walls, consistent with prior CAD in LAD    territory.   Stage I diastolic dysfunction.   RV appears normal in size and function.   Grossly normal valvular structure and function without any evidence of    significant stenosis or regurgitation.   Compared to prior echocardiogram in July 2024 from [facility],    overall no major change in LV function.         XR Chest Ap Portable   Final Result   IMPRESSION:      Cardiomegaly without CHF.  Clear lungs.  Prominent scoliosis.         Electronically Signed by: Christopher Gerlach, Cline on 05/13/2023 1:33 AM      Pharmacolgic Stress Test Panel    (Results Pending)        Pertinent Labs:    Cardiac Labs:  Recent Labs     Units 05/13/23  0445 05/13/23  0130   CK ng/mL 871*  4.60  --    BNP pg/mL  --  873     CBC:  Recent Labs     Units 05/14/23  0510 05/13/23  0130   WBC thou/mcL 6.6 8.7   HGB gm/dL 86.6* 86.3*   PLT thou/mcL 212 218     BMP:  Recent Labs     Units 05/14/23  0510 05/13/23  0130   NA mmol/L 139 139   K mmol/L 4.4 3.8   CL mmol/L 106 103   CO2 mmol/L 23 23   BUN mg/dL 14 18   CREATININE mg/dL 8.89 8.58*   MAGNESIUM mg/dL  --  2.1     Lipid Panel:  Recent Labs     Units 05/13/23  0445   CHOL mg/dL 800   TRIG mg/dL 82   HDL mg/dL 52   LDL mg/dL 868*     Liver Enzymes:  Recent Labs     Units 05/14/23  0510 05/13/23  0130   AST U/L 19 27   ALT U/L 13 17   ALKPHOS U/L 104 114   BILITOT mg/dL 9.78 9.56     Endocrine Panels:  Recent Labs     Units 05/14/23  0510 05/13/23  0130   HGBA1C % 6.0*  --    GLUCOSE mg/dL 874* [facility]     Physicians involved in care during this hospitalization  Attending Provider: Clem LITTIE Drilling, Cline  Attending Provider: Lynwood CINDERELLA Cramp, Cline  Attending Provider: Donnice SHAUNNA Minerva, Cline  Admitting Provider: Lynwood CINDERELLA Cramp, Cline  Consulting Physician: Christopher R Zagol, Cline  Consulting Physician: Christopher JONELLE Gallus, Cline  Consulting Physician: Christopher LOISE Blush, Cline  HPI per admitting provider [name], Cline 05/13/23:    Christopher Cline is a 52 y.o. male hypertension, hyperlipidemia, CAD s/p STEMI 02/05/23 requiring angioplasty and DES (onyx) , Tobacco dep, + UDS for fentanyl in the past, who presents with chest pain starting Monday morning at about 10 AM.  Is been intermittent since then.  Patient notes associated shortness of breath as well as diaphoresis and nausea.  Patient also notes lightheadedness.  Patient describes the chest pain as substernal sharp radiating to the right and left arm.  And increasing in intensity yesterday and therefore causing him to present to the ED for evaluation.     Patient also notes that his left inguinal hernia  has been bothering him ever since he had some type of abdominal surgery about 5 to 6 months ago at [facility].     In ED temperature 98.2 pulse 101 blood pressure 172/104, pulse ox 96% on room air     WBC 8.7, hemoglobin 13.6 platelet count 218  Sodium 139, potassium 3.8, BUN 18, creatinine 1.41, glucose 110     AST 27, ALT 17     Troponin 29>>>> 29>>>> 28     BNP 873     EKG sinus tach at 100, normal axis, normal PR, prolonged QTc, left atrial enlargement, ST elevation in V3-4, when reviewing old EKG from July ST segment elevation was also present.     Chest x-ray-no acute process        Patient will be admitted for workup of chest pain with elevated troponin      Hospital Course:       The patient was admitted with continuous cardiac monitoring.  Cardiology and general surgery was consulted.    Unfortunately, patient reports noncompliant with medication for the past 2 months including antiplatelet therapy.      He was given gentle IV fluids.  Repeat labs were stable and essentially unremarkable. Mild AKI resolved.    A nuclear medicine stress test was completed that showed a high risk assessment with multifocal infarcts, dilated cardiomyopathy, however with no ischemia.  An echocardiogram was completed that showed an EF of 40 to 45% with hypokinesis of mid to distal anterior lateral and apical lateral walls, consistent with prior CAD in LAD territory, stage I diastolic dysfunction, no significant change when compared to July 2024.    General surgery is recommending outpatient follow-up for management of large left inguinal hernia.  Cardiology reports that he will need to wait at least 6 months prior to considering holding his antiplatelet therapy.    Case was discussed this morning with cardiology who has cleared the patient for discharge from their perspective with recommendations for medication compliance and outpatient follow-up.    On assessment today, the patient is seen sitting upright in bed.  He  is alert, answering questions appropriately and in no distress.  He denies chest pain or shortness of breath and states that he feels okay today.  He did request to speak to peer support specialist regarding outpatient EtOH detox therapy.  He currently denies any withdrawal symptoms.  He is in agreement with discharge plan. Labs and vitals have remained stable and the patient remains medically stable for discharge. Patient verbalized understanding of need for close outpatient follow up with PCP and cardiology, as well as returning to the Emergency Department for any new or worsening symptoms or concerns.     Of note, discharge is conditional on peer support specialist speaking with patient  and case management ensuring that he can get his prescriptions filled.      BP (!) 158/97   Pulse 80   Temp 98.4 F (36.9 C) (Oral)   Resp 22   Ht 1.803 m (5' 11)   Wt 91.8 kg (202 lb 6.1 oz)   SpO2 100%   BMI 28.23 kg/m     Physical Exam  Vitals reviewed.   Constitutional:       General: He is not in acute distress.     Appearance: He is not toxic-appearing or diaphoretic.   HENT:      Mouth/Throat:      Mouth: Mucous membranes are moist.   Cardiovascular:      Rate and Rhythm: Normal rate and regular rhythm.      Pulses: Normal pulses.      Heart sounds: No murmur heard.  Musculoskeletal:      Right lower leg: No edema.      Left lower leg: No edema.   Pulmonary:      Effort: Pulmonary effort is normal. No respiratory distress.      Breath sounds: Normal breath sounds. No wheezing or rhonchi.   Abdominal:      General: Abdomen is flat. Bowel sounds are normal. There is no distension.      Palpations: Abdomen is soft.      Tenderness: There is no abdominal tenderness.   Skin:     General: Skin is warm and dry.      Capillary Refill: Capillary refill takes less than 2 seconds.   Neurological:      General: No focal deficit present.      Mental Status: He is alert and oriented to person, place, and time.   Psychiatric:          Mood and Affect: Mood normal.         Behavior: Behavior normal    Post Hospital Care     Activity:      Weight Bearing Status:                Oxygen Orders for Discharge:  O2 Device: None (Room air)  SpO2: 100 %    Diet:  Diet and Nourishment Orders (From admission, onward)       Start        05/13/23 1457  Cardiac diet  Diet effective now                            Wound Care Recommendations:  None      Lines/Drains/Airways:  Patient Lines/Drains/Airways Status       Active LDAs       None                    Therapy Recommendations:   PT:                            OT:                   SLP:                      Home Health Orders:  DME Orders (From admission, onward)      None          Home Health Agency       None  I spent 50 minutes performing discharge services.     Electronically signed:  Alan MARLA Marc, NP  05/14/2023 / 11:00 AM

## 2024-07-21 ENCOUNTER — Inpatient Hospital Stay
Admit: 2024-07-21 | Discharge: 2024-07-21 | Discharge status: Against Medical Advice | Payer: Medicaid (Managed Care) | Arrived: VH | Attending: Emergency Medicine

## 2024-07-21 ENCOUNTER — Emergency Department: Admit: 2024-07-21 | Payer: Medicaid (Managed Care)

## 2024-07-21 LAB — COMPREHENSIVE METABOLIC PANEL
ALT: 91 U/L — ABNORMAL HIGH (ref 10–50)
AST: 66 U/L — ABNORMAL HIGH (ref 10–50)
Albumin/Globulin Ratio: 1.2 (ref 1.1–2.2)
Albumin: 4.2 g/dL (ref 3.5–5.2)
Alk Phosphatase: 108 U/L (ref 40–129)
Anion Gap: 13 mmol/L (ref 2–14)
BUN/Creatinine Ratio: 15 (ref 12–20)
BUN: 19 mg/dL (ref 6–20)
CO2: 24 mmol/L (ref 20–29)
Calcium: 10.1 mg/dL — ABNORMAL HIGH (ref 8.6–10.0)
Chloride: 98 mmol/L (ref 98–107)
Creatinine: 1.25 mg/dL — ABNORMAL HIGH (ref 0.70–1.20)
Est, Glom Filt Rate: 69 ml/min/1.73m2 (ref >59)
Globulin: 3.5 g/dL (ref 2.0–4.0)
Glucose: 123 mg/dL — ABNORMAL HIGH (ref 65–100)
Potassium: 4.3 mmol/L (ref 3.5–5.1)
Sodium: 136 mmol/L (ref 136–145)
Total Bilirubin: 0.5 mg/dL (ref 0.0–1.2)
Total Protein: 7.7 g/dL (ref 6.4–8.3)

## 2024-07-21 LAB — EKG 12-LEAD
Atrial Rate: 79 {beats}/min
Atrial Rate: 67 {beats}/min
Diagnosis: NORMAL
Diagnosis: NORMAL
P Axis: 50 degrees
P Axis: -8 degrees
P-R Interval: 176 ms
P-R Interval: 196 ms
Q-T Interval: 402 ms
Q-T Interval: 458 ms
QRS Duration: 98 ms
QRS Duration: 106 ms
QTc Calculation (Bazett): 460 ms
QTc Calculation (Bazett): 483 ms
R Axis: 57 degrees
R Axis: 33 degrees
T Axis: 252 degrees
T Axis: 193 degrees
Ventricular Rate: 79 {beats}/min
Ventricular Rate: 67 {beats}/min

## 2024-07-21 LAB — CBC WITH AUTO DIFFERENTIAL
Basophils %: 0.3 % (ref 0.0–1.0)
Basophils Absolute: 0.02 K/UL (ref 0.00–0.10)
Eosinophils %: 1.9 % (ref 0.0–7.0)
Eosinophils Absolute: 0.14 K/UL (ref 0.00–0.40)
Hematocrit: 44.8 % (ref 36.6–50.3)
Hemoglobin: 15 g/dL (ref 12.1–17.0)
Immature Granulocytes %: 0.4 % (ref 0.0–0.5)
Immature Granulocytes Absolute: 0.03 K/UL (ref 0.00–0.04)
Lymphocytes %: 10.9 % — ABNORMAL LOW (ref 12.0–49.0)
Lymphocytes Absolute: 0.8 K/UL (ref 0.80–3.50)
MCH: 29.5 pg (ref 26.0–34.0)
MCHC: 33.5 g/dL (ref 30.0–36.5)
MCV: 88.2 FL (ref 80.0–99.0)
MPV: 10.2 FL (ref 8.9–12.9)
Monocytes %: 9.2 % (ref 5.0–13.0)
Monocytes Absolute: 0.67 K/UL (ref 0.00–1.00)
Neutrophils %: 77.3 % — ABNORMAL HIGH (ref 32.0–75.0)
Neutrophils Absolute: 5.64 K/UL (ref 1.80–8.00)
Nucleated RBCs: 0 /100{WBCs}
Platelets: 216 K/uL (ref 150–400)
RBC: 5.08 M/uL (ref 4.10–5.70)
RDW: 14.6 % — ABNORMAL HIGH (ref 11.5–14.5)
WBC: 7.3 K/uL (ref 4.1–11.1)
nRBC: 0 K/uL (ref 0.00–0.01)

## 2024-07-21 LAB — TROPONIN
Troponin T: 51.8 ng/L — ABNORMAL HIGH (ref 0–22)
Troponin T: 44.5 ng/L — ABNORMAL HIGH (ref 0–22)

## 2024-07-21 LAB — CARDIAC PROCEDURE: Body Surface Area: 2.09 m2

## 2024-07-21 LAB — LIPASE: Lipase: 25 U/L (ref 13–60)

## 2024-07-21 LAB — EXTRA TUBES HOLD

## 2024-07-21 LAB — LACTIC ACID: Lactic Acid: 0.7 mmol/L (ref 0.5–2.0)

## 2024-07-21 MED ORDER — SODIUM CHLORIDE 0.9 % IV SOLN
0.9 | Freq: Once | INTRAVENOUS | Status: AC
Start: 2024-07-21 — End: 2024-07-21
  Administered 2024-07-21: 18:00:00 500 mg via INTRAVENOUS

## 2024-07-21 MED ORDER — LIDOCAINE HCL 1 % IJ SOLN
1 | INTRAMUSCULAR | Status: AC
Start: 2024-07-21 — End: 2024-07-21

## 2024-07-21 MED ORDER — HEPARIN (PORCINE) IN NACL 1000-0.9 UT/500ML-% IV SOLN
1000-0.9 | INTRAVENOUS | Status: AC
Start: 2024-07-21 — End: 2024-07-21

## 2024-07-21 MED ORDER — IOHEXOL 350 MG/ML IV SOLN
350 | Freq: Once | INTRAVENOUS | Status: AC | PRN
Start: 2024-07-21 — End: 2024-07-21
  Administered 2024-07-21: 16:00:00 100 mL via INTRAVENOUS

## 2024-07-21 MED ORDER — VERAPAMIL HCL 2.5 MG/ML IV SOLN
2.5 | INTRAVENOUS | Status: AC
Start: 2024-07-21 — End: 2024-07-21

## 2024-07-21 MED ORDER — MORPHINE SULFATE (PF) 4 MG/ML IV SOLN
4 | INTRAVENOUS | Status: AC
Start: 2024-07-21 — End: 2024-07-21
  Administered 2024-07-21: 17:00:00 4 mg via INTRAVENOUS

## 2024-07-21 MED ORDER — STERILE WATER FOR INJECTION (MIXTURES ONLY)
1 | Freq: Once | INTRAMUSCULAR | Status: AC
Start: 2024-07-21 — End: 2024-07-21
  Administered 2024-07-21: 18:00:00 1000 mg via INTRAVENOUS

## 2024-07-21 MED FILL — OMNIPAQUE 350 MG/ML IV SOLN: 350 mg/mL | INTRAVENOUS | Qty: 100 | Fill #0

## 2024-07-21 MED FILL — XYLOCAINE 1 % IJ SOLN: 1 % | INTRAMUSCULAR | Qty: 20 | Fill #0

## 2024-07-21 MED FILL — MORPHINE SULFATE 4 MG/ML IV SOLN: 4 mg/mL | INTRAVENOUS | Qty: 1 | Fill #0

## 2024-07-21 MED FILL — HEPARIN (PORCINE) IN NACL 1000-0.9 UT/500ML-% IV SOLN: 1000-0.9 UT/500ML-% | INTRAVENOUS | Qty: 1000 | Fill #0

## 2024-07-21 MED FILL — VERAPAMIL HCL 2.5 MG/ML IV SOLN: 2.5 mg/mL | INTRAVENOUS | Qty: 2 | Fill #0

## 2024-07-21 MED FILL — AZITHROMYCIN 500 MG IV SOLR: 500 mg | INTRAVENOUS | Qty: 500 | Fill #0

## 2024-07-21 MED FILL — CEFTRIAXONE SODIUM 1 G IJ SOLR: 1 g | INTRAMUSCULAR | Qty: 1000 | Fill #0

## 2024-07-21 NOTE — ED Notes (Signed)
 Patient report given to Ashley E. RN using SBAR and MAR.

## 2024-07-21 NOTE — Progress Notes (Signed)
 Pt was signed for cardiac cath and gave me a consent to have it done. He was brought down to cath lab on his own will. He was prepped on the table to have it done and then he suddenly felt he can not go through with it. He demanded to be left alone so that he can leave AMA. He signed AMA before leaving. I called ER doctor who took care of him.

## 2024-07-21 NOTE — ED Provider Notes (Signed)
 [facility] EM[facility] DEPARTMENT  EMERGENCY DEPARTMENT ENCOUNTER      Pt Name: Christopher Cline  MRN: [id number]  Birthdate 02/19/1971  Date of evaluation: 07/21/2024  Provider: Reyes Caprice, MD    CHIEF COMPLAINT       Chief Complaint   Patient presents with    Chest Pain    Abdominal Pain         HISTORY OF PRESENT ILLNESS   (Location/Symptom, Timing/Onset, Context/Setting, Quality, Duration, Modifying Factors, Severity)  Note limiting factors.   Christopher Cline is a 54yo male who presents to the ER with complaints of abdominal pain and chest pain.  He states that his pain started yesterday at noon.  He reports it is a crampy pain that his entire abdomen went down to his bilateral legs.  He also reports that he has had pain in his chest to go to his bilateral shoulders.  He said this feels somewhat similar to when he has had an MI in the past.  He went to [facility] yesterday afternoon.  He said they did a workup but he is not exactly sure what they found.  He reports that he has stent placed in his heart about 1 week ago.  He denies fevers or chills.  He reports some dizziness and shortness of breath.  He denies any other complaints.            Review of External Medical Records:     Nursing Notes were reviewed.    REVIEW OF SYSTEMS    (2-9 systems for level 4, 10 or more for level 5)     Review of Systems   Cardiovascular:  Positive for chest pain.   Gastrointestinal:  Positive for abdominal pain.       Except as noted above the remainder of the review of systems was reviewed and negative.       PAST MEDICAL HISTORY     Past Medical History:   Diagnosis Date    Asthma     Hypertension          SURGICAL HISTORY     History reviewed. No pertinent surgical history.      CURRENT MEDICATIONS       Previous Medications    HYOSCYAMINE (ANASPAZ;LEVSIN) 125 MCG TABLET    Take 0.125 mg by mouth every 4 hours as needed    LISINOPRIL (PRINIVIL;ZESTRIL) 10 MG TABLET    Take by mouth daily    MELOXICAM (MOBIC) 15 MG  TABLET    Take 15 mg by mouth daily    ONDANSETRON (ZOFRAN) 4 MG TABLET    Take 4 mg by mouth every 8 hours as needed       ALLERGIES     Lisinopril    FAMILY HISTORY     History reviewed. No pertinent family history.       SOCIAL HISTORY       Social History     Socioeconomic History    Marital status: Single     Spouse name: None    Number of children: None    Years of education: None    Highest education level: None   Tobacco Use    Smoking status: Every Day     Current packs/day: 1.00     Types: Cigarettes   Vaping Use    Vaping status: Never Used   Substance and Sexual Activity    Alcohol use: Never    Drug use: Not Currently  Social Drivers of Health     Food Insecurity: No Food Insecurity (05/13/2023)    Received from [facility]    Hunger Vital Sign     Within the past 12 months, you worried that your food would run out before you got the money to buy more.: Never true     Within the past 12 months, the food you bought just didn't last and you didn't have money to get more.: Never true   Transportation Needs: No Transportation Needs (05/13/2023)    Received from [facility]    PRAPARE - Transportation     Lack of Transportation (Medical): No     Lack of Transportation (Non-Medical): No   Stress: No Stress Concern Present (05/13/2023)    Received from [facility] - Occupational Stress Questionnaire     Feeling of Stress : Only a little    Received [facility]    Social Network   Intimate Partner Violence: Not At Risk (05/13/2023)    Received from [facility]    HITS     Over the last 12 months how often did your partner physically hurt you?: Never     Over the last 12 months how often did your partner insult you or talk down to you?: Never     Over the last 12 months how often did your partner threaten you with physical harm?: Never     Over the last 12 months how often did your partner scream or curse at you?: Never   Housing Stability: Low Risk  (05/13/2023)    Received from [facility] Stability Vital Sign     Unable to Pay for Housing in the Last Year: No     Number of Places Lived in the Last Year: 1     Unstable Housing in the Last Year: No           PHYSICAL EXAM    (up to 7 for level 4, 8 or more for level 5)     ED Triage Vitals [07/21/24 0659]   BP Girls Systolic BP Percentile Girls Diastolic BP Percentile Boys Systolic BP Percentile Boys Diastolic BP Percentile Temp Temp Source Pulse   (!) 147/98 -- -- -- -- 97.9 F (36.6 C) Oral 91      Respirations SpO2 Height Weight - Scale       16 94 % 1.803 m (5' 11) 87.1 kg (192 lb)           Body mass index is 26.78 kg/m.    Physical Exam        Vital signs reviewed.  Nursing notes reviewed.    Const:  No acute distress, well developed, well nourished  Head:  Atraumatic, normocephalic  Eyes:  PERRL, conjunctiva normal, no scleral icterus  Neck:  Supple, trachea midline  Cardiovascular:  regular rate  Resp:  No resp distress, no increased work of breathing  Abd:  Soft, non-tender, non-distended, no rebound, no guarding  MSK:  No pedal edema, normal ROM  Neuro:  Alert and oriented x3, no cranial nerve defect  Skin:  Warm, dry, intact  Psych: normal mood and affect, behavior is normal, judgement and thought content is normal            DIAGNOSTIC RESULTS     EKG: All EKG's are interpreted by the Emergency Department Physician who either signs or Co-signs this chart in the absence  of a cardiologist.        RADIOLOGY:   Non-plain film images such as CT, Ultrasound and MRI are read by the radiologist. Plain radiographic images are visualized and preliminarily interpreted by the emergency physician with the below findings:        Interpretation per the Radiologist below, if available at the time of this note:    CT ABDOMEN PELVIS W IV CONTRAST Additional Contrast? None   Final Result   1. No acute PE. Bilateral groundglass opacities nonspecific likely infectious or   inflammatory   2. Sludge within  the gallbladder. Gallbladder is not distended      3. Diverticulosis. No acute diverticulitis         Electronically signed by Susie Brain      CTA CHEST W WO CONTRAST PE Eval   Final Result   1. No acute PE. Bilateral groundglass opacities nonspecific likely infectious or   inflammatory   2. Sludge within the gallbladder. Gallbladder is not distended      3. Diverticulosis. No acute diverticulitis         Electronically signed by Susie Brain           LABS:  Labs Reviewed   CBC WITH AUTO DIFFERENTIAL - Abnormal; Notable for the following components:       Result Value    RDW 14.6 (*)     Neutrophils % 77.3 (*)     Lymphocytes % 10.9 (*)     All other components within normal limits   COMPREHENSIVE METABOLIC PANEL - Abnormal; Notable for the following components:    Glucose 123 (*)     Creatinine 1.25 (*)     Calcium 10.1 (*)     ALT 91 (*)     AST 66 (*)     All other components within normal limits   TROPONIN - Abnormal; Notable for the following components:    Troponin T 51.8 (*)     All other components within normal limits   TROPONIN - Abnormal; Notable for the following components:    Troponin T 44.5 (*)     All other components within normal limits   EXTRA TUBES HOLD   LIPASE       All other labs were within normal range or not returned as of this dictation.    EMERGENCY DEPARTMENT COURSE and DIFFERENTIAL DIAGNOSIS/MDM:   Vitals:    Vitals:    07/21/24 0659 07/21/24 1125   BP: (!) 147/98 (!) 163/95   Pulse: 91 68   Resp: 16 21   Temp: 97.9 F (36.6 C) 98 F (36.7 C)   TempSrc: Oral Oral   SpO2: 94% 99%   Weight: 87.1 kg (192 lb)    Height: 1.803 m (5' 11)            Medical Decision Making  Amount and/or Complexity of Data Reviewed  External Data Reviewed: notes.  Labs: ordered.  Radiology: ordered.  ECG/medicine tests: ordered.    Risk  Prescription drug management.        Perfect Serve Consult for Admission  12:44 PM    ED Room Number: ER07/07  Patient Name and age:  Christopher Cline 54 y.o.   male  Working Diagnosis:   1. Chest pain, unspecified type    2. Pneumonia due to infectious organism, unspecified laterality, unspecified part of lung        COVID-19 Suspicion: No  Sepsis present:  No  Reassessment needed: No  Readmission: No  Isolation Requirements: no  Recommended Level of Care: telemetry  Department: [facility] Adult ED - [phone]  Consulting Provider: seen by Dr. Claudie with cardiology    Other: Seen by cardiology.  They plan to take him to the Cath Lab later today.  He had a cath done at [facility] 1 week ago and has recurrent pain.    Christopher Cline is a 54yo male who presents to the ER with complaints of abdominal pain and chest pain.  He had a cath done 1 week ago at a different hospital.  His troponin is elevated but is trending downwards.  He was seen by cardiology who will plan to take him to the Cath Lab.  CT shows possible pneumonia.  Of cover him with antibiotics.  He is to be evaluated for admission by the hospitalist      REASSESSMENT            CONSULTS:  IP CONSULT TO CARDIOLOGY    PROCEDURES:  Unless otherwise noted below, none     Procedures      FINAL IMPRESSION      1. Chest pain, unspecified type    2. Pneumonia due to infectious organism, unspecified laterality, unspecified part of lung          DISPOSITION/PLAN   DISPOSITION Decision To Admit 07/21/2024 12:37:56 PM      PATIENT REFERRED TO:  No follow-up provider specified.    DISCHARGE MEDICATIONS:  New Prescriptions    No medications on file         (Please note that portions of this note were completed with a voice recognition program.  Efforts were made to edit the dictations but occasionally words are mis-transcribed.)    Reyes Caprice, MD (electronically signed)  Emergency Attending Physician / Physician Assistant / Nurse Practitioner              Caprice Reyes HERO, MD  07/21/24 1246

## 2024-07-21 NOTE — Consults (Signed)
 Cardiology Consult Note    CC: CP  Reason for consult:  Unstable angina  Requesting MD:  Dr. Bearl     Subjective:      Date of  Admission: 07/21/2024  8:39 AM     Admission type:Emergency    Christopher Cline is a 54 y.o. male admitted for No admission diagnoses are documented for this encounter..Patient complains of SS chest pain and upper abdominal pain with weakness and SOB. He states that it feels just like when he had MI in past. He endorses CAD, MI, s/p stents(two in [place] and one recently a few days ago). He was clearly started on Effient and he still has bandage over his Rt femoral site when cath was done. We are not able to get any info on his recent PCI. He actually went back to [facility] last night but released with no change in therapy.      No primary care provider on file.  Past Medical History:   Diagnosis Date    Asthma     Hypertension       History reviewed. No pertinent surgical history.  Allergies   Allergen Reactions    Lisinopril Anaphylaxis      History reviewed. No pertinent family history.   No current facility-administered medications for this encounter.     Current Outpatient Medications   Medication Sig    hyoscyamine (ANASPAZ;LEVSIN) 125 MCG tablet Take 0.125 mg by mouth every 4 hours as needed    lisinopril (PRINIVIL;ZESTRIL) 10 MG tablet Take by mouth daily    meloxicam (MOBIC) 15 MG tablet Take 15 mg by mouth daily    ondansetron (ZOFRAN) 4 MG tablet Take 4 mg by mouth every 8 hours as needed        Prior to Admission Medications:  Prior to Admission medications   Medication Sig Start Date End Date Taking? Authorizing Provider   hyoscyamine (ANASPAZ;LEVSIN) 125 MCG tablet Take 0.125 mg by mouth every 4 hours as needed 01/03/18   Automatic Reconciliation, Ar   lisinopril (PRINIVIL;ZESTRIL) 10 MG tablet Take by mouth daily    Automatic Reconciliation, Ar   meloxicam (MOBIC) 15 MG tablet Take 15 mg by mouth daily 12/30/17   Automatic Reconciliation, Ar   ondansetron (ZOFRAN) 4 MG tablet  Take 4 mg by mouth every 8 hours as needed 01/03/18   Automatic Reconciliation, Ar        Review of Symptoms:  Except as noted in HPI, patient denies recent fever or chills, nausea, vomiting, diarrhea, hemoptysis, hematemesis, dysuria, myalgias, focal neurologic symptoms, ecchymosis, angioedema, odynophagia, dysphagia, sore throat, earache,rash, melena, hematochezia, depression, GERD, cold intolerance, petechia, bleeding gums, or significant weight loss.    A comprehensive review of systems was negative.     Subjective:    24 hr VS reviewed, overall VSSAF  Temp (24hrs), Avg:98 F (36.7 C), Min:97.9 F (36.6 C), Max:98 F (36.7 C)    Patient Vitals for the past 8 hrs:   Pulse   07/21/24 1125 68   07/21/24 0659 91    Patient Vitals for the past 8 hrs:   Resp   07/21/24 1125 21   07/21/24 0659 16    Patient Vitals for the past 8 hrs:   BP   07/21/24 1125 (!) 163/95   07/21/24 0659 (!) 147/98        No intake or output data in the 24 hours ending 07/21/24 1233      Physical Exam (complete single organ system  exam)    Cons: The patient is no distress. Appears stated age.   HEENT: Normal conjunctivae and palate. No xanthelasma.  Neck: Flat JVP without appreciable HJR.  Resp: Normal respiratory effort with clear lungs bilaterally.   CV: Regular rate and rhythm.   PMI not palpated. Normal S1,S2  No gallop or rubs appreciated.  No murmur apprciated.  Intact carotid upstroke bilaterally without appreciated bruits.  Abdominal aorta not palpated; no abdominal bruit noted.  Normal femoral pulses without bruits.  Intact pedal pulses.     No peripheral edema.  GI: No abd mass noted, soft; no organomegaly noted.  Bowel sounds present.   Muscular:  No significant kyphosis.  Strength WNL for age.  Ext: No cyanosis, clubbing, or stigmata of peripheral embolization.   Derm: No ulcers or stasis dermatitis of lower extremities.   Neuro: Alert and oriented x 3;  Grossly non-focal. Normal mood and affect.        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   CBC with Auto Differential    Collection Time: 07/21/24  7:25 AM   Result Value Ref Range    WBC 7.3 4.1 - 11.1 K/uL    RBC 5.08 4.10 - 5.70 M/uL    Hemoglobin 15.0 12.1 - 17.0 g/dL    Hematocrit 55.1 63.3 - 50.3 %    MCV 88.2 80.0 - 99.0 FL    MCH 29.5 26.0 - 34.0 PG    MCHC 33.5 30.0 - 36.5 g/dL    RDW 85.3 (H) 88.4 - 14.5 %    Platelets 216 150 - 400 K/uL    MPV 10.2 8.9 - 12.9 FL    Nucleated RBCs 0.0 0 PER 100 WBC    nRBC 0.00 0.00 - 0.01 K/uL    Neutrophils % 77.3 (H) 32.0 - 75.0 %    Lymphocytes % 10.9 (L) 12.0 - 49.0 %    Monocytes % 9.2 5.0 - 13.0 %    Eosinophils % 1.9 0.0 - 7.0 %    Basophils % 0.3 0.0 - 1.0 %    Immature Granulocytes % 0.4 0.0 - 0.5 %    Neutrophils Absolute 5.64 1.80 - 8.00 K/UL    Lymphocytes Absolute 0.80 0.80 - 3.50  K/UL    Monocytes Absolute 0.67 0.00 - 1.00 K/UL    Eosinophils Absolute 0.14 0.00 - 0.40 K/UL    Basophils Absolute 0.02 0.00 - 0.10 K/UL    Immature Granulocytes Absolute 0.03 0.00 - 0.04 K/UL     Differential Type SMEAR SCANNED      RBC Comment ANISOCYTOSIS  1+       Comprehensive Metabolic Panel    Collection Time: 07/21/24  7:25 AM   Result Value Ref Range    Sodium 136 136 - 145 mmol/L    Potassium 4.3 3.5 - 5.1 mmol/L    Chloride 98 98 - 107 mmol/L    CO2 24 20 - 29 mmol/L    Anion Gap 13 2 - 14 mmol/L    Glucose 123 (H) 65 - 100 mg/dL    BUN 19 6 - 20 MG/DL    Creatinine 8.74 (H) 0.70 - 1.20 MG/DL    BUN/Creatinine Ratio 15 12 - 20      Est, Glom Filt Rate 69 >59 ml/min/1.51m2    Calcium 10.1 (H) 8.6 - 10.0 MG/DL    Total Bilirubin 0.5 0.0 - 1.2 MG/DL    ALT 91 (H) 10 - 50 U/L    AST 66 (H) 10 - 50 U/L    Alk Phosphatase 108 40 - 129 U/L    Total Protein 7.7 6.4 - 8.3 g/dL    Albumin 4.2 3.5 - 5.2 g/dL    Globulin 3.5 2.0 - 4.0 g/dL    Albumin/Globulin Ratio 1.2 1.1 - 2.2     Lipase    Collection Time: 07/21/24  7:25 AM   Result Value Ref Range    Lipase 25 13 - 60 U/L   Troponin    Collection Time: 07/21/24  7:25 AM   Result Value Ref Range    Troponin T 51.8 (H) 0 - 22 ng/L   Troponin    Collection Time: 07/21/24 10:59 AM   Result Value Ref Range    Troponin T 44.5 (H) 0 - 22 ng/L   EKG 12 Lead    Collection Time: 07/21/24 11:26 AM   Result Value Ref Range    Ventricular Rate 67 BPM    Atrial Rate 67 BPM    P-R Interval 196 ms    QRS Duration 106 ms    Q-T Interval 458 ms    QTc Calculation (Bazett) 483 ms    P Axis -8 degrees    R Axis 33 degrees    T Axis 193 degrees    Diagnosis       Normal sinus rhythm  Possible Lateral infarct , age undetermined  Possible Inferior infarct , age undetermined  Abnormal ECG  When compared with ECG of 21-Jul-2024 07:07,  MANUAL COMPARISON REQUIRED DATA IS UNCONFIRMED          Assessment:     Assessment:   CP; consistent with ACS with mildly elevated troponin; recurrent and visiting ERs  CAD; s/p stents in past including a recent one  HTN; stable  HLD      Plan:   Tele  He needs to have re-look cath    Maude Hutch, MD

## 2024-07-21 NOTE — ED Triage Notes (Signed)
 Pt presents to the ED from home c/o chest pain and abdominal pain since noon yesterday.    Pt was seen at [facility] last night and was discharged around 0620 today    Meds PTA:IV morphine  and IV diluadid @ [facility]

## 2024-07-26 LAB — CULTURE, BLOOD 1: Culture: NO GROWTH

## 2024-07-26 LAB — CULTURE, BLOOD 2: Culture: NO GROWTH
